# Patient Record
Sex: Male | Born: 1955 | Race: White | Hispanic: No | Marital: Single | Smoking: Former smoker
Health system: Southern US, Community
[De-identification: ages and names within clinical notes are randomized; demographics above are authoritative.]

## PROBLEM LIST (undated history)

## (undated) DIAGNOSIS — R000 Tachycardia, unspecified: Secondary | ICD-10-CM

## (undated) DIAGNOSIS — R51 Headache: Secondary | ICD-10-CM

## (undated) DIAGNOSIS — Q249 Congenital malformation of heart, unspecified: Secondary | ICD-10-CM

## (undated) DIAGNOSIS — J449 Chronic obstructive pulmonary disease, unspecified: Secondary | ICD-10-CM

## (undated) DIAGNOSIS — Q248 Other specified congenital malformations of heart: Secondary | ICD-10-CM

## (undated) DIAGNOSIS — I10 Essential (primary) hypertension: Secondary | ICD-10-CM

## (undated) DIAGNOSIS — R519 Headache, unspecified: Secondary | ICD-10-CM

## (undated) DIAGNOSIS — G8929 Other chronic pain: Secondary | ICD-10-CM

## (undated) DIAGNOSIS — G4733 Obstructive sleep apnea (adult) (pediatric): Secondary | ICD-10-CM

## (undated) DIAGNOSIS — E785 Hyperlipidemia, unspecified: Secondary | ICD-10-CM

## (undated) HISTORY — DX: Other chronic pain: G89.29

## (undated) HISTORY — DX: Congenital malformation of heart, unspecified: Q24.9

## (undated) HISTORY — DX: Headache, unspecified: R51.9

## (undated) HISTORY — DX: Tachycardia, unspecified: R00.0

## (undated) HISTORY — DX: Chronic obstructive pulmonary disease, unspecified: J44.9

## (undated) HISTORY — DX: Hyperlipidemia, unspecified: E78.5

## (undated) HISTORY — DX: Obstructive sleep apnea (adult) (pediatric): G47.33

## (undated) HISTORY — DX: Headache: R51

## (undated) HISTORY — DX: Other specified congenital malformations of heart: Q24.8

## (undated) HISTORY — DX: Essential (primary) hypertension: I10

---

## 1998-01-28 ENCOUNTER — Inpatient Hospital Stay (HOSPITAL_COMMUNITY): Admission: EM | Admit: 1998-01-28 | Discharge: 1998-02-03 | Payer: Self-pay | Admitting: Emergency Medicine

## 1998-01-28 ENCOUNTER — Encounter: Payer: Self-pay | Admitting: Emergency Medicine

## 1998-01-28 ENCOUNTER — Encounter: Payer: Self-pay | Admitting: Surgery

## 1998-01-28 ENCOUNTER — Encounter: Payer: Self-pay | Admitting: Pediatrics

## 1999-06-09 ENCOUNTER — Encounter: Payer: Self-pay | Admitting: General Surgery

## 1999-06-10 ENCOUNTER — Observation Stay (HOSPITAL_COMMUNITY): Admission: RE | Admit: 1999-06-10 | Discharge: 1999-06-11 | Payer: Self-pay | Admitting: General Surgery

## 2000-06-24 ENCOUNTER — Emergency Department (HOSPITAL_COMMUNITY): Admission: EM | Admit: 2000-06-24 | Discharge: 2000-06-24 | Payer: Self-pay | Admitting: Emergency Medicine

## 2000-06-24 ENCOUNTER — Encounter: Payer: Self-pay | Admitting: Emergency Medicine

## 2001-10-19 ENCOUNTER — Encounter: Payer: Self-pay | Admitting: Emergency Medicine

## 2001-10-19 ENCOUNTER — Emergency Department (HOSPITAL_COMMUNITY): Admission: EM | Admit: 2001-10-19 | Discharge: 2001-10-19 | Payer: Self-pay | Admitting: Emergency Medicine

## 2003-10-21 ENCOUNTER — Emergency Department (HOSPITAL_COMMUNITY): Admission: EM | Admit: 2003-10-21 | Discharge: 2003-10-21 | Payer: Self-pay | Admitting: Emergency Medicine

## 2006-05-30 ENCOUNTER — Encounter: Admission: RE | Admit: 2006-05-30 | Discharge: 2006-05-30 | Payer: Self-pay | Admitting: Family Medicine

## 2007-11-14 ENCOUNTER — Emergency Department (HOSPITAL_COMMUNITY): Admission: EM | Admit: 2007-11-14 | Discharge: 2007-11-14 | Payer: Self-pay | Admitting: Emergency Medicine

## 2008-03-16 ENCOUNTER — Emergency Department (HOSPITAL_COMMUNITY): Admission: EM | Admit: 2008-03-16 | Discharge: 2008-03-16 | Payer: Self-pay | Admitting: Emergency Medicine

## 2008-04-20 ENCOUNTER — Ambulatory Visit: Payer: Self-pay | Admitting: Pulmonary Disease

## 2008-04-20 ENCOUNTER — Encounter: Payer: Self-pay | Admitting: Pulmonary Disease

## 2008-04-20 DIAGNOSIS — G4733 Obstructive sleep apnea (adult) (pediatric): Secondary | ICD-10-CM | POA: Insufficient documentation

## 2008-04-20 DIAGNOSIS — J309 Allergic rhinitis, unspecified: Secondary | ICD-10-CM | POA: Insufficient documentation

## 2008-04-20 DIAGNOSIS — I10 Essential (primary) hypertension: Secondary | ICD-10-CM | POA: Insufficient documentation

## 2008-04-20 DIAGNOSIS — R0602 Shortness of breath: Secondary | ICD-10-CM | POA: Insufficient documentation

## 2008-04-20 DIAGNOSIS — I499 Cardiac arrhythmia, unspecified: Secondary | ICD-10-CM | POA: Insufficient documentation

## 2008-04-27 ENCOUNTER — Encounter: Payer: Self-pay | Admitting: Pulmonary Disease

## 2008-09-17 ENCOUNTER — Emergency Department (HOSPITAL_COMMUNITY): Admission: EM | Admit: 2008-09-17 | Discharge: 2008-09-17 | Payer: Self-pay | Admitting: Emergency Medicine

## 2009-02-25 ENCOUNTER — Encounter: Admission: RE | Admit: 2009-02-25 | Discharge: 2009-02-25 | Payer: Self-pay | Admitting: Family Medicine

## 2009-07-16 ENCOUNTER — Encounter: Admission: RE | Admit: 2009-07-16 | Discharge: 2009-07-16 | Payer: Self-pay | Admitting: Family Medicine

## 2010-04-30 LAB — COMPREHENSIVE METABOLIC PANEL
Albumin: 4.1 g/dL (ref 3.5–5.2)
Creatinine, Ser: 1.09 mg/dL (ref 0.4–1.5)
Glucose, Bld: 108 mg/dL — ABNORMAL HIGH (ref 70–99)
Potassium: 3.7 mEq/L (ref 3.5–5.1)
Sodium: 140 mEq/L (ref 135–145)

## 2010-04-30 LAB — URINALYSIS, ROUTINE W REFLEX MICROSCOPIC
Bilirubin Urine: NEGATIVE
Glucose, UA: NEGATIVE mg/dL
Hgb urine dipstick: NEGATIVE
Nitrite: NEGATIVE
pH: 6.5 (ref 5.0–8.0)

## 2010-04-30 LAB — DIFFERENTIAL
Eosinophils Absolute: 0.1 10*3/uL (ref 0.0–0.7)
Monocytes Relative: 11 % (ref 3–12)

## 2010-04-30 LAB — CBC
HCT: 49.3 % (ref 39.0–52.0)
MCHC: 34.6 g/dL (ref 30.0–36.0)
MCV: 90 fL (ref 78.0–100.0)
Platelets: 161 10*3/uL (ref 150–400)
RBC: 5.48 MIL/uL (ref 4.22–5.81)
RDW: 13.6 % (ref 11.5–15.5)

## 2010-04-30 LAB — LIPASE, BLOOD: Lipase: 20 U/L (ref 11–59)

## 2010-04-30 LAB — LACTIC ACID, PLASMA: Lactic Acid, Venous: 2.3 mmol/L — ABNORMAL HIGH (ref 0.5–2.2)

## 2010-05-10 LAB — DIFFERENTIAL
Basophils Absolute: 0 10*3/uL (ref 0.0–0.1)
Basophils Relative: 1 % (ref 0–1)
Eosinophils Absolute: 0.1 10*3/uL (ref 0.0–0.7)
Eosinophils Relative: 2 % (ref 0–5)
Lymphs Abs: 1.1 10*3/uL (ref 0.7–4.0)
Monocytes Absolute: 0.8 10*3/uL (ref 0.1–1.0)
Monocytes Relative: 14 % — ABNORMAL HIGH (ref 3–12)
Neutro Abs: 3.3 10*3/uL (ref 1.7–7.7)
Neutrophils Relative %: 63 % (ref 43–77)

## 2010-05-10 LAB — BLOOD GAS, ARTERIAL
Patient temperature: 98.6
TCO2: 16.4 mmol/L (ref 0–100)
pH, Arterial: 7.594 — ABNORMAL HIGH (ref 7.350–7.450)

## 2010-05-10 LAB — POCT I-STAT, CHEM 8
BUN: 17 mg/dL (ref 6–23)
Chloride: 107 mEq/L (ref 96–112)
Glucose, Bld: 99 mg/dL (ref 70–99)
Hemoglobin: 16 g/dL (ref 13.0–17.0)
Sodium: 141 mEq/L (ref 135–145)

## 2010-05-10 LAB — CBC
HCT: 46.9 % (ref 39.0–52.0)
Hemoglobin: 16.3 g/dL (ref 13.0–17.0)
MCHC: 34.7 g/dL (ref 30.0–36.0)
Platelets: 135 10*3/uL — ABNORMAL LOW (ref 150–400)
RDW: 13 % (ref 11.5–15.5)
WBC: 5.4 10*3/uL (ref 4.0–10.5)

## 2010-10-24 LAB — BASIC METABOLIC PANEL
BUN: 19
CO2: 24
Calcium: 9.1
Creatinine, Ser: 1.01
GFR calc Af Amer: >60
GFR calc non Af Amer: >60
Glucose, Bld: 140 — ABNORMAL HIGH
Potassium: 4.2
Sodium: 140

## 2010-10-24 LAB — DIFFERENTIAL
Eosinophils Relative: 1
Lymphocytes Relative: 10 — ABNORMAL LOW
Lymphs Abs: 1
Monocytes Absolute: 0.6

## 2010-10-24 LAB — CBC
HCT: 48.7
Hemoglobin: 16.9
WBC: 10.4

## 2010-10-24 LAB — POCT CARDIAC MARKERS
Myoglobin, poc: 127
Troponin i, poc: <0.05

## 2010-10-24 LAB — BLOOD GAS, ARTERIAL
Bicarbonate: 22.8
TCO2: 19.4
pCO2 arterial: 35.7
pH, Arterial: 7.421

## 2010-10-24 LAB — CULTURE, BLOOD (ROUTINE X 2)

## 2012-04-26 ENCOUNTER — Ambulatory Visit (INDEPENDENT_AMBULATORY_CARE_PROVIDER_SITE_OTHER)
Admission: RE | Admit: 2012-04-26 | Discharge: 2012-04-26 | Disposition: A | Discharge status: Home / Self Care | Payer: 59 | Source: Ambulatory Visit | Attending: Internal Medicine | Admitting: Internal Medicine

## 2012-04-26 ENCOUNTER — Encounter: Payer: Self-pay | Admitting: Internal Medicine

## 2012-04-26 ENCOUNTER — Ambulatory Visit (INDEPENDENT_AMBULATORY_CARE_PROVIDER_SITE_OTHER): Payer: 59 | Admitting: Internal Medicine

## 2012-04-26 VITALS — BP 154/100 | HR 94 | Temp 98.3°F | Ht 70.5 in | Wt 263.0 lb

## 2012-04-26 DIAGNOSIS — R0609 Other forms of dyspnea: Secondary | ICD-10-CM

## 2012-04-26 DIAGNOSIS — I10 Essential (primary) hypertension: Secondary | ICD-10-CM

## 2012-04-26 DIAGNOSIS — F17200 Nicotine dependence, unspecified, uncomplicated: Secondary | ICD-10-CM

## 2012-04-26 DIAGNOSIS — J441 Chronic obstructive pulmonary disease with (acute) exacerbation: Secondary | ICD-10-CM

## 2012-04-26 DIAGNOSIS — J449 Chronic obstructive pulmonary disease, unspecified: Secondary | ICD-10-CM | POA: Insufficient documentation

## 2012-04-26 DIAGNOSIS — R0600 Dyspnea, unspecified: Secondary | ICD-10-CM

## 2012-04-26 MED ORDER — FAMOTIDINE 20 MG PO TABS: ORAL_TABLET | ORAL | Status: DC

## 2012-04-26 MED ORDER — TRAMADOL HCL 50 MG PO TABS: ORAL_TABLET | ORAL | Status: DC

## 2012-04-26 MED ORDER — PANTOPRAZOLE SODIUM 40 MG PO TBEC: 40.0000 mg | DELAYED_RELEASE_TABLET | Freq: Every day | ORAL | Status: DC

## 2012-04-26 NOTE — Progress Notes (Signed)
  Subjective:    Patient ID: James Kidd, male    DOB: 07/15/1955    MRN: [id number]  HPI  69 yowm smoker dx copd by [facility] referred by Dr Cyndia Bent for re-evaluation   04/26/2012 1st pulmonary cc indolent onset x > 10 year sob progressed to point where can still walk distances like the mall but has to be slow and flat.  Ok at rest. Has to sleep at 30 degrees due to breathing and then episodes where much worse, esp with cold wet weather,  Assoc with daily dry cough so hard passes out occasionally but frequently feels presyncope with cough assoc with midline chest discomfort. Take daily spiriva and lots of saba but doesn't think it helps much. Prednisone helped a lot the last time (2 months prior to OV  He took it.  No obvious daytime variabilty or assoc chronic cough or cp or chest tightness, subjective wheeze overt sinus or hb symptoms. No unusual exp hx or h/o childhood pna/ asthma or premature birth to his knowledge.   Once gets to sleep at 30 degrees Sleeping ok without nocturnal  or early am exacerbation  of respiratory  c/o's or need for noct saba. Also denies any obvious fluctuation of symptoms with weather or environmental changes or other aggravating or alleviating factors except as outlined above   Review of Systems  Constitutional: Negative for fever, chills, activity change, appetite change and unexpected weight change.  HENT: Positive for congestion. Negative for sore throat, rhinorrhea, sneezing, trouble swallowing, dental problem, voice change and postnasal drip.   Eyes: Negative for visual disturbance.  Respiratory: Positive for cough and shortness of breath. Negative for choking.   Cardiovascular: Positive for chest pain and leg swelling.  Gastrointestinal: Negative for nausea, vomiting and abdominal pain.  Genitourinary: Negative for difficulty urinating.  Musculoskeletal: Positive for arthralgias.  Skin: Negative for rash.  Psychiatric/Behavioral: Negative for behavioral  problems and confusion.       Objective:   Physical Exam  Wt Readings from Last 3 Encounters:  04/26/12 263 lb (119.296 kg)    Hoarse wm nad at rest HEENT mild turbinate edema.  Oropharynx no thrush or excess pnd or cobblestoning.  No JVD or cervical adenopathy. Mild accessory muscle hypertrophy. Trachea midline, nl thryroid. Chest was hyperinflated by percussion with diminished breath sounds and moderate increased exp time without wheeze. Hoover sign positive at mid inspiration. Regular rate and rhythm without murmur gallop or rub or increase P2 or edema.  Abd: no hsm, nl excursion. Ext warm without cyanosis or clubbing.    CXR  04/26/2012 :  No active disease. No significant change.         Assessment & Plan:

## 2012-04-26 NOTE — Patient Instructions (Addendum)
Take delsym two tsp every 12 hours and supplement if needed with  tramadol 50 mg up to 2 every 4 hours to suppress the urge to cough. Swallowing water or using ice chips/non mint and menthol containing candies (such as lifesavers or sugarless jolly ranchers) are also effective.  You should rest your voice and avoid activities that you know make you cough.  Once you have eliminated the cough for 3 straight days try reducing the tramadol first,  then the delsym as tolerated.    Stop spiriva and take symbicort 160 Take 2 puffs first thing in am and then another 2 puffs about 12 hours later.   Pantoprazole (protonix) 40 mg   Take 30-60 min before first meal of the day and Pepcid 20 mg one bedtime until return to office - this is the best way to tell whether stomach acid is contributing to your problem.    Only use your albuterol(proventil)  as a rescue medication to be used if you can't catch your breath by resting or doing a relaxed purse lip breathing pattern. The less you use it, the better it will work when you need it.  Ok to use up to every 4 hours   GERD (REFLUX)  is an extremely common cause of respiratory symptoms, many times with no significant heartburn at all.    It can be treated with medication, but also with lifestyle changes including avoidance of late meals, excessive alcohol, smoking cessation, and avoid fatty foods, chocolate, peppermint, colas, red wine, and acidic juices such as orange juice.  NO MINT OR MENTHOL PRODUCTS SO NO COUGH DROPS  USE SUGARLESS CANDY INSTEAD (jolley ranchers or Stover's)  NO OIL BASED VITAMINS - use powdered substitutes.   Please remember to go to the x-ray department downstairs for your tests - we will call you with the results when they are available.     Please schedule a follow up office visit in 4 weeks, sooner if needed with pfts

## 2012-04-27 DIAGNOSIS — F17200 Nicotine dependence, unspecified, uncomplicated: Secondary | ICD-10-CM | POA: Insufficient documentation

## 2012-04-27 NOTE — Assessment & Plan Note (Signed)
Says "never changes, even on multiple meds"  > defer to Dr Cyndia Bent

## 2012-04-27 NOTE — Assessment & Plan Note (Addendum)
-   PFT's 04/20/08  FEV1  2.54 (69%) ratio 58 and no change p B2  DLCO 55 corrects to 88 - hfa 75% p coaching 04/27/2012    DDX of  difficult airways managment all start with A and  include Adherence, Ace Inhibitors, Acid Reflux, Active Sinus Disease, Alpha 1 Antitripsin deficiency, Anxiety masquerading as Airways dz,  ABPA,  allergy(esp in young), Aspiration (esp in elderly), Adverse effects of DPI,  Active smokers, plus two Bs  = Bronchiectasis and Beta blocker use..and one C= CHF   Adherence is always the initial "prime suspect" and is a multilayered concern that requires a "trust but verify" approach in every patient - starting with knowing how to use medications, especially inhalers, correctly, keeping up with refills and understanding the fundamental difference between maintenance and prns vs those medications only taken for a very short course and then stopped and not refilled. The proper method of use, as well as anticipated side effects, of a metered-dose inhaler are discussed and demonstrated to the patient. Improved effectiveness after extensive coaching during this visit to a level of approximately  75% so try symbicort 160 2bid  ? Adverse effect of dpi > try off spiriva  Active smoking > discussed separately  ? Acid reflux > max rx reviewed  F/u in 4 weeks with pft    Each maintenance medication was reviewed in detail including most importantly the difference between maintenance and as needed and under what circumstances the prns are to be used.  Please see instructions for details which were reviewed in writing and the patient given a copy.

## 2012-04-27 NOTE — Assessment & Plan Note (Signed)

## 2012-04-29 NOTE — Progress Notes (Signed)
Quick Note:  Spoke with pt and notified of results per Dr. Wert. Pt verbalized understanding and denied any questions.  ______ 

## 2012-06-07 ENCOUNTER — Ambulatory Visit (INDEPENDENT_AMBULATORY_CARE_PROVIDER_SITE_OTHER): Payer: 59 | Admitting: Internal Medicine

## 2012-06-07 ENCOUNTER — Encounter: Payer: Self-pay | Admitting: Internal Medicine

## 2012-06-07 VITALS — BP 130/82 | HR 80 | Temp 97.8°F | Ht 71.0 in | Wt 257.0 lb

## 2012-06-07 DIAGNOSIS — J449 Chronic obstructive pulmonary disease, unspecified: Secondary | ICD-10-CM

## 2012-06-07 DIAGNOSIS — F17200 Nicotine dependence, unspecified, uncomplicated: Secondary | ICD-10-CM

## 2012-06-07 LAB — PULMONARY FUNCTION TEST

## 2012-06-07 NOTE — Patient Instructions (Addendum)
You only have mild copd which is likely to stabilize where it is if your stop smoking now, which will also help your cough  Continue symbicort Take 2 puffs first thing in am and then another 2 puffs about 12 hours later.   Only use your albuterol (proventil)as a rescue medication to be used if you can't catch your breath by resting or doing a relaxed purse lip breathing pattern. The less you use it, the better it will work when you need it.    If you are satisfied with your treatment plan let your doctor know and he/she can either refill your medications or you can return here when your prescription runs out.     If in any way you are not 100% satisfied,  please tell us.  If 100% better, tell your friends!

## 2012-06-07 NOTE — Progress Notes (Signed)
PFT done today. 

## 2012-06-07 NOTE — Progress Notes (Signed)
Subjective:    Patient ID: James Kidd, male    DOB: August 06, 1955    MRN: [id number]   Brief patient profile:  33 yowm smoker dx copd by [facility] referred by Dr James Kidd for re-evaluation with GOLD II criteria as of 05/2012    04/26/2012 1st pulmonary cc indolent onset x > 10 year sob progressed to point where can still walk distances like the mall but has to be slow and flat.  Ok at rest. Has to sleep at 30 degrees due to breathing and then episodes where much worse, esp with cold wet weather,  Assoc with daily dry cough so hard passes out occasionally but frequently feels presyncope with cough assoc with midline chest discomfort. Take daily spiriva and lots of saba but doesn't think it helps much. Prednisone helped a lot the last time (2 months prior to OV  He took it. rec Take delsym two tsp every 12 hours and supplement if needed with  tramadol 50 mg up to 2 every 4 hours to suppress the urge to cough. Swallowing water or using ice chips/non mint and menthol containing candies (such as lifesavers or sugarless jolly ranchers) are also effective.  You should rest your voice and avoid activities that you know make you cough. Once you have eliminated the cough for 3 straight days try reducing the tramadol first,  then the delsym as tolerated.   Stop spiriva and take symbicort 160 Take 2 puffs first thing in am and then another 2 puffs about 12 hours later.  Pantoprazole (protonix) 40 mg   Take 30-60 min before first meal of the day and Pepcid 20 mg one bedtime until return to office - this is the best way to tell whether stomach acid is contributing to your problem.   Only use your albuterol(proventil)  as a rescue medication   06/07/2012 f/u ov/James Kidd f/u copd GOLD II / still smoking  Chief Complaint  Patient presents with  . Followup with PFT    Pt states his breathing is about the same, no better or worse since the last visit. No new co's today.   still has sense of chest congestion day = night  but cough severity is better, min mucoid sputut, doe so still using saba more than twice daily    No obvious daytime variabilty or assoc  cp or chest tightness, subjective wheeze overt sinus or hb symptoms. No unusual exp hx or h/o childhood pna/ asthma or premature birth to his knowledge.   Once gets to sleep at 30 degrees Sleeping ok without nocturnal  or early am exacerbation  of respiratory  c/o's or need for noct saba. Also denies any obvious fluctuation of symptoms with weather or environmental changes or other aggravating or alleviating factors except as outlined above   Current Medications, Allergies, Past Medical History, Past Surgical History, Family History, and Social History were reviewed in James Kidd record.  ROS  The following are not active complaints unless bolded sore throat, dysphagia, dental problems, itching, sneezing,  nasal congestion or excess/ purulent secretions, ear ache,   fever, chills, sweats, unintended wt loss, pleuritic or exertional cp, hemoptysis,  orthopnea pnd or leg swelling, presyncope, palpitations, heartburn, abdominal pain, anorexia, nausea, vomiting, diarrhea  or change in bowel or urinary habits, change in stools or urine, dysuria,hematuria,  rash, arthralgias, visual complaints, headache, numbness weakness or ataxia or problems with walking or coordination,  change in mood/affect or memory.  Objective:   Physical Exam  Wt Readings from Last 3 Encounters:  06/07/12 257 lb (116.574 kg)  04/26/12 263 lb (119.296 kg)      Hoarse wm nad at rest/ evasive with questions HEENT mild turbinate edema.  Oropharynx no thrush or excess pnd or cobblestoning.  No JVD or cervical adenopathy. Mild accessory muscle hypertrophy. Trachea midline, nl thryroid. Chest was hyperinflated by percussion with diminished breath sounds and moderate increased exp time without wheeze. Hoover sign positive at mid inspiration. Regular rate and  rhythm without murmur gallop or rub or increase P2 or edema.  Abd: no hsm, nl excursion. Ext warm without cyanosis or clubbing.    CXR  04/26/2012 :  No active disease. No significant change.         Assessment & Plan:

## 2012-06-09 NOTE — Assessment & Plan Note (Signed)
>   3 min  I reviewed the Flethcher curve with patient that basically indicates  if you quit smoking when your best day FEV1 is still well preserved(which his clearly is)  it is highly unlikely you will progress to severe disease and informed the patient there was no medication on the market that has proven to change the curve or the likelihood of progression.  Therefore stopping smoking and maintaining abstinence is the most important aspect of care, not choice of inhalers or for that matter, doctors.    Cough should improve very quickly once stops smoking as should over reliance on saba

## 2012-06-09 NOTE — Assessment & Plan Note (Signed)
-   PFT's 04/20/08  FEV1  2.54 (69%) ratio 58 and no change p B2  DLCO 55 corrects to 88 - PFT's 06/07/2012  FEV1  1.93 (49%)  But 65% p B2 (31% improvement) DLCO 77 - hfa 90% 06/07/2012 p coaching   So post bronchodilator he is not much different from 2010 but unfortunately still smoking  DDX of  difficult airways managment all start with A and  include Adherence, Ace Inhibitors, Acid Reflux, Active Sinus Disease, Alpha 1 Antitripsin deficiency, Anxiety masquerading as Airways dz,  ABPA,  allergy(esp in young), Aspiration (esp in elderly), Adverse effects of DPI,  Active smokers, plus two Bs  = Bronchiectasis and Beta blocker use..and one C= CHF   In this case Adherence is the biggest issue and starts with  inability to use HFA effectively and also  understand that SABA treats the symptoms but doesn't get to the underlying problem (inflammation).  I used  the analogy of putting steroid cream on a rash to help explain the meaning of topical therapy and the need to get the drug to the target tissue.  The proper method of use, as well as anticipated side effects, of a metered-dose inhaler are discussed and demonstrated to the patient. Improved effectiveness after extensive coaching during this visit to a level of approximately 90% from a baseline of < 50%  rec continue symbicort 160 2bid

## 2012-06-19 ENCOUNTER — Encounter: Payer: Self-pay | Admitting: Internal Medicine

## 2012-11-19 ENCOUNTER — Encounter: Payer: Self-pay | Admitting: Interventional Cardiology

## 2012-11-20 ENCOUNTER — Ambulatory Visit (INDEPENDENT_AMBULATORY_CARE_PROVIDER_SITE_OTHER): Payer: 59 | Admitting: Interventional Cardiology

## 2012-11-20 ENCOUNTER — Encounter: Payer: Self-pay | Admitting: Interventional Cardiology

## 2012-11-20 ENCOUNTER — Encounter (INDEPENDENT_AMBULATORY_CARE_PROVIDER_SITE_OTHER): Payer: Self-pay

## 2012-11-20 VITALS — BP 150/100 | HR 89 | Ht 70.0 in | Wt 268.8 lb

## 2012-11-20 DIAGNOSIS — I517 Cardiomegaly: Secondary | ICD-10-CM | POA: Insufficient documentation

## 2012-11-20 DIAGNOSIS — R05 Cough: Secondary | ICD-10-CM

## 2012-11-20 DIAGNOSIS — I5032 Chronic diastolic (congestive) heart failure: Secondary | ICD-10-CM

## 2012-11-20 DIAGNOSIS — I10 Essential (primary) hypertension: Secondary | ICD-10-CM

## 2012-11-20 DIAGNOSIS — R0609 Other forms of dyspnea: Secondary | ICD-10-CM

## 2012-11-20 DIAGNOSIS — R0600 Dyspnea, unspecified: Secondary | ICD-10-CM

## 2012-11-20 DIAGNOSIS — G4733 Obstructive sleep apnea (adult) (pediatric): Secondary | ICD-10-CM

## 2012-11-20 MED ORDER — LISINOPRIL-HYDROCHLOROTHIAZIDE 20-25 MG PO TABS: 1.0000 | ORAL_TABLET | Freq: Every day | ORAL | Status: DC

## 2012-11-20 NOTE — Progress Notes (Signed)
Patient ID: James Kidd, male   DOB: April 01, 1955, 57 y.o.   MRN: [id number]   Date: 11/20/2012 ID: James Kidd, DOB [birth year], MRN [id number] PCP: Eartha Inch, MD  Reason: Elevated blood pressure and dyspnea  ASSESSMENT;  1. Hypertension, essential with poor control and history of hypertrophy based on echocardiogram done at [facility] (data not available) 2. Dyspnea on exertion, probably multifactorial. Rule out component of diastolic heart failure 3. Left ventricular hypertrophy (documentation by echocardiography within the past year , according to the patient) 4. Chronic cough with occasional tussive syncope  PLAN:  1. Intensify antihypertensive regimen by increasing lisinopril to 20 mg daily along with 20 mg of furosemide. Once the current supply of medications is use we will switch to lisinopril HCT 20/25 mg daily 2. Six-week clinical followup 3. Basic metabolic panel will be done on return   SUBJECTIVE: James Kidd is a 57 y.o. male who is referred for help with blood pressure control and to exclude the possibility of cardiac involvement causing dyspnea. The patient has dyspnea on exertion. His been seen by [facility] and been told that his lungs are "okay". Pulmonary function tests were apparently done and used as the basis for the conclusion. He has been seen by cardiologists at [facility] where he underwent an echocardiogram which revealed LVH and a myocardial perfusion study that did not reveal ischemia. He subsequently has had a difficult time achieving blood pressure control. Recently he developed lower extremity edema and Dr. Cyndia Bent started furosemide 10 mg daily and resumed lisinopril 10 mg daily. Independently, the patient increase the furosemide to 20 mg per day and had dramatic improvement in edema. He denies palpitations, orthopnea, PND, chest pain, abdominal swelling, and transient neurological symptoms.   Allergies  Allergen  Reactions  . Codeine Sulfate Itching    Current Outpatient Prescriptions on File Prior to Visit  Medication Sig Dispense Refill  . famotidine (PEPCID) 20 MG tablet One at bedtime  30 tablet  2  . fluticasone (FLONASE) 50 MCG/ACT nasal spray Place 2 sprays into the nose daily as needed for rhinitis.      Marland Kitchen guaiFENesin (MUCINEX) 600 MG 12 hr tablet Take 600 mg by mouth 2 (two) times daily as needed for congestion.      Marland Kitchen PROVENTIL HFA 108 (90 BASE) MCG/ACT inhaler Inhale 1-2 puffs into the lungs every 6 (six) hours as needed.      . SYMBICORT 160-4.5 MCG/ACT inhaler 2 puffs twice daily      . traMADol (ULTRAM) 50 MG tablet 1-2 every 4 hours as needed for cough or pain  40 tablet  0   No current facility-administered medications on file prior to visit.    Past Medical History  Diagnosis Date  . Hypertension   . COPD (chronic obstructive pulmonary disease)   . Tachycardia   . Left ventricular hypoplasia   . OSA (obstructive sleep apnea)     on CPAP   . Chronic headaches   . Hyperlipidemia     No past surgical history on file.  History   Social History  . Marital Status: Single    Spouse Name: N/A    Number of Children: N/A  . Years of Education: N/A   Occupational History  . Not on file.   Social History Main Topics  . Smoking status: Current Every Day Smoker -- 1.00 packs/day for 40 years    Types: Cigarettes  . Smokeless tobacco: Never Used  . Alcohol Use:  No  . Drug Use: No  . Sexual Activity: Not on file   Other Topics Concern  . Not on file   Social History Narrative  . No narrative on file    Family History  Problem Relation Age of Onset  . Colon cancer Father   . Heart disease Maternal Aunt     ROS: Stable appetite. Recent weight gain. No blood in urine or stool. Chronic cough. Paroxysmal to cough have resulted in syncope. Denies hemoptysis. Other systems negative for complaints.  OBJECTIVE: BP 150/100  Pulse 89  Ht 5\' 10"  (1.778 m)  Wt 268 lb  12.8 oz (121.927 kg)  BMI 38.57 kg/m2,  General: No acute distress, obese HEENT: normal  Neck: JVD flat. Carotids no bruits Chest: Clear but with diminished breath sounds Cardiac: Murmur: 2 of 6 systolic murmur left lower sternal border. Gallop: S4. Rhythm: Regular. Other: Normal Abdomen: Bruit: None. Pulsation: None Extremities: Edema: None. Pulses: 2+ and symmetric in the radials and posterior tibials bilaterally Neuro: Normal Psych: Normal  ECG: Normal sinus rhythm with nonspecific ST abnormality. No LVH noted

## 2012-11-20 NOTE — Patient Instructions (Signed)
Start Lisinopril HCTZ 20/25mg  1 tablet daily in 1 week  Your physician recommends that you schedule a follow-up appointment in: 6 weeks  Your physician recommends that you return for lab work in: 6 weeks (BMET)

## 2013-01-01 ENCOUNTER — Encounter: Payer: Self-pay | Admitting: Interventional Cardiology

## 2013-01-01 ENCOUNTER — Ambulatory Visit (INDEPENDENT_AMBULATORY_CARE_PROVIDER_SITE_OTHER): Payer: 59 | Admitting: Interventional Cardiology

## 2013-01-01 VITALS — BP 130/93 | HR 92 | Ht 71.0 in | Wt 273.0 lb

## 2013-01-01 DIAGNOSIS — R05 Cough: Secondary | ICD-10-CM | POA: Insufficient documentation

## 2013-01-01 DIAGNOSIS — I10 Essential (primary) hypertension: Secondary | ICD-10-CM

## 2013-01-01 DIAGNOSIS — I5032 Chronic diastolic (congestive) heart failure: Secondary | ICD-10-CM

## 2013-01-01 DIAGNOSIS — I517 Cardiomegaly: Secondary | ICD-10-CM

## 2013-01-01 NOTE — Progress Notes (Addendum)
Patient ID: James Kidd, male   DOB: Oct 08, 1955, 57 y.o.   MRN: [id number]      [address] [place], [place]  [address] Phone: [phone] Fax:  [phone]  Date:  01/01/2013   ID:  James Kidd, DOB 15-Aug-1955, MRN [id number]  PCP:  Eartha Inch, MD   ASSESSMENT:  1. Dyspnea is persistent but improved 2. Hypertension with LVH. Hypertension is improved on the current medical regimen. 3. COPD  PLAN:  1. Continue lisinopril HCT 20/25 mg daily 2. Return to see me as needed for recurrent syncope or uncontrollable blood pressure 3. I considered adding a low-dose beta blocker but felt this may aggravate his pulmonary function   SUBJECTIVE: James Kidd is a 57 y.o. male who denies recurrence of syncope. Cough has been somewhat improved. Exertional tolerance is slightly improved. Headaches have significantly decreased. Weight is up 5 pounds since last visit and 16 pounds since May.   Wt Readings from Last 3 Encounters:  01/01/13 273 lb (123.832 kg)  11/20/12 268 lb 12.8 oz (121.927 kg)  06/07/12 257 lb (116.574 kg)     Past Medical History  Diagnosis Date  . Hypertension   . COPD (chronic obstructive pulmonary disease)   . Tachycardia   . Left ventricular hypoplasia   . OSA (obstructive sleep apnea)     on CPAP   . Chronic headaches   . Hyperlipidemia     Current Outpatient Prescriptions  Medication Sig Dispense Refill  . cetirizine (ZYRTEC) 10 MG tablet Take 10 mg by mouth as needed for allergies.      . famotidine (PEPCID) 20 MG tablet Take 20 mg by mouth at bedtime as needed. One at bedtime      . fluticasone (FLONASE) 50 MCG/ACT nasal spray Place 2 sprays into the nose daily as needed for rhinitis.      Marland Kitchen guaiFENesin (MUCINEX) 600 MG 12 hr tablet Take 600 mg by mouth 2 (two) times daily as needed for congestion.      Marland Kitchen lisinopril-hydrochlorothiazide (PRINZIDE,ZESTORETIC) 20-25 MG per tablet Take 1 tablet by mouth daily.  30 tablet  5   . PROVENTIL HFA 108 (90 BASE) MCG/ACT inhaler Inhale 1-2 puffs into the lungs as needed.       . tiotropium (SPIRIVA) 18 MCG inhalation capsule Place 18 mcg into inhaler and inhale daily.       No current facility-administered medications for this visit.    Allergies:    Allergies  Allergen Reactions  . Codeine Sulfate Itching    Social History:  The patient  reports that he has been smoking Cigarettes.  He has a 40 pack-year smoking history. He has never used smokeless tobacco. He reports that he does not drink alcohol or use illicit drugs.   ROS:  Please see the history of present illness.   All other systems reviewed and negative.   OBJECTIVE: VS:  BP 130/93  Pulse 92  Ht 5\' 11"  (1.803 m)  Wt 273 lb (123.832 kg)  BMI 38.09 kg/m2 Well nourished, well developed, in no acute distress, obese HEENT: normal Neck: JVD flat. Carotid bruit 2+  Cardiac:  normal S1, S2; RRR; no murmur. S4 gallop is audible Lungs:  clear to auscultation bilaterally, no wheezing, rhonchi or rales Abd: soft, nontender, no hepatomegaly Ext: Edema absent. Pulses 2+ Skin: warm and dry Neuro:  CNs 2-12 intact, no focal abnormalities noted  EKG:  Not repeated    Signed, Barry Dienes.  Flo Shanks III, MD 01/01/2013 1:58 PM

## 2013-01-01 NOTE — Patient Instructions (Signed)
Your physician recommends that you continue on your current medications as directed. Please refer to the Current Medication list given to you today.   Your physician recommends that you schedule a follow-up appointment as needed  

## 2013-05-30 ENCOUNTER — Encounter (HOSPITAL_COMMUNITY): Payer: Self-pay | Admitting: Emergency Medicine

## 2013-05-30 ENCOUNTER — Emergency Department (HOSPITAL_COMMUNITY): Payer: 59

## 2013-05-30 ENCOUNTER — Inpatient Hospital Stay (HOSPITAL_COMMUNITY)
Admission: EM | Admit: 2013-05-30 | Discharge: 2013-06-02 | DRG: 192 | Disposition: A | Discharge status: Home / Self Care | Payer: 59 | Attending: Emergency Medicine | Admitting: Emergency Medicine

## 2013-05-30 DIAGNOSIS — I499 Cardiac arrhythmia, unspecified: Secondary | ICD-10-CM

## 2013-05-30 DIAGNOSIS — R0603 Acute respiratory distress: Secondary | ICD-10-CM

## 2013-05-30 DIAGNOSIS — R05 Cough: Secondary | ICD-10-CM

## 2013-05-30 DIAGNOSIS — F411 Generalized anxiety disorder: Secondary | ICD-10-CM | POA: Diagnosis present

## 2013-05-30 DIAGNOSIS — I10 Essential (primary) hypertension: Secondary | ICD-10-CM | POA: Diagnosis present

## 2013-05-30 DIAGNOSIS — J441 Chronic obstructive pulmonary disease with (acute) exacerbation: Principal | ICD-10-CM | POA: Diagnosis present

## 2013-05-30 DIAGNOSIS — I517 Cardiomegaly: Secondary | ICD-10-CM

## 2013-05-30 DIAGNOSIS — I5032 Chronic diastolic (congestive) heart failure: Secondary | ICD-10-CM

## 2013-05-30 DIAGNOSIS — F17200 Nicotine dependence, unspecified, uncomplicated: Secondary | ICD-10-CM | POA: Diagnosis present

## 2013-05-30 DIAGNOSIS — R0602 Shortness of breath: Secondary | ICD-10-CM

## 2013-05-30 DIAGNOSIS — R55 Syncope and collapse: Secondary | ICD-10-CM

## 2013-05-30 DIAGNOSIS — G4733 Obstructive sleep apnea (adult) (pediatric): Secondary | ICD-10-CM | POA: Diagnosis present

## 2013-05-30 DIAGNOSIS — J301 Allergic rhinitis due to pollen: Secondary | ICD-10-CM | POA: Diagnosis present

## 2013-05-30 DIAGNOSIS — Z800 Family history of malignant neoplasm of digestive organs: Secondary | ICD-10-CM

## 2013-05-30 DIAGNOSIS — Z8249 Family history of ischemic heart disease and other diseases of the circulatory system: Secondary | ICD-10-CM

## 2013-05-30 DIAGNOSIS — J309 Allergic rhinitis, unspecified: Secondary | ICD-10-CM

## 2013-05-30 DIAGNOSIS — Z6838 Body mass index (BMI) 38.0-38.9, adult: Secondary | ICD-10-CM

## 2013-05-30 DIAGNOSIS — J9600 Acute respiratory failure, unspecified whether with hypoxia or hypercapnia: Secondary | ICD-10-CM

## 2013-05-30 DIAGNOSIS — D696 Thrombocytopenia, unspecified: Secondary | ICD-10-CM | POA: Diagnosis present

## 2013-05-30 DIAGNOSIS — E785 Hyperlipidemia, unspecified: Secondary | ICD-10-CM | POA: Diagnosis present

## 2013-05-30 DIAGNOSIS — R0902 Hypoxemia: Secondary | ICD-10-CM | POA: Diagnosis present

## 2013-05-30 DIAGNOSIS — E876 Hypokalemia: Secondary | ICD-10-CM | POA: Diagnosis present

## 2013-05-30 DIAGNOSIS — J449 Chronic obstructive pulmonary disease, unspecified: Secondary | ICD-10-CM

## 2013-05-30 LAB — BLOOD GAS, ARTERIAL
Acid-Base Excess: 0 mmol/L (ref 0.0–2.0)
Bicarbonate: 22.7 mEq/L (ref 20.0–24.0)
DRAWN BY: 235321
FIO2: 0.3 %
MODE: POSITIVE
O2 Saturation: 99.3 %
PATIENT TEMPERATURE: 97.3
PEEP: 4 cmH2O
Pressure support: 4 cmH2O
TCO2: 19 mmol/L (ref 0–100)
pCO2 arterial: 32 mmHg — ABNORMAL LOW (ref 35.0–45.0)
pH, Arterial: 7.461 — ABNORMAL HIGH (ref 7.350–7.450)
pO2, Arterial: 167 mmHg — ABNORMAL HIGH (ref 80.0–100.0)

## 2013-05-30 LAB — CBC
HCT: 44.2 % (ref 39.0–52.0)
Hemoglobin: 16.1 g/dL (ref 13.0–17.0)
MCH: 30.7 pg (ref 26.0–34.0)
MCHC: 36.4 g/dL — ABNORMAL HIGH (ref 30.0–36.0)
MCV: 84.4 fL (ref 78.0–100.0)
PLATELETS: 129 10*3/uL — AB (ref 150–400)
RBC: 5.24 MIL/uL (ref 4.22–5.81)
RDW: 13.2 % (ref 11.5–15.5)
WBC: 5.3 10*3/uL (ref 4.0–10.5)

## 2013-05-30 LAB — TROPONIN I
Troponin I: <0.3 ng/mL (ref <0.30)
Troponin I: <0.3 ng/mL (ref <0.30)

## 2013-05-30 LAB — COMPREHENSIVE METABOLIC PANEL
ALT: 36 U/L (ref 0–53)
AST: 32 U/L (ref 0–37)
Albumin: 3.7 g/dL (ref 3.5–5.2)
Alkaline Phosphatase: 67 U/L (ref 39–117)
BUN: 12 mg/dL (ref 6–23)
CALCIUM: 9.1 mg/dL (ref 8.4–10.5)
CO2: 24 meq/L (ref 19–32)
CREATININE: 1 mg/dL (ref 0.50–1.35)
Chloride: 96 mEq/L (ref 96–112)
GFR, EST NON AFRICAN AMERICAN: 83 mL/min — AB (ref >90)
GLUCOSE: 150 mg/dL — AB (ref 70–99)
Potassium: 3.3 mEq/L — ABNORMAL LOW (ref 3.7–5.3)
Sodium: 137 mEq/L (ref 137–147)
TOTAL PROTEIN: 7.4 g/dL (ref 6.0–8.3)
Total Bilirubin: 0.7 mg/dL (ref 0.3–1.2)

## 2013-05-30 LAB — I-STAT TROPONIN, ED: Troponin i, poc: 0 ng/mL (ref 0.00–0.08)

## 2013-05-30 LAB — PRO B NATRIURETIC PEPTIDE: PRO B NATRI PEPTIDE: 54.6 pg/mL (ref 0–125)

## 2013-05-30 LAB — MRSA PCR SCREENING: MRSA BY PCR: NEGATIVE

## 2013-05-30 MED ORDER — SALINE SPRAY 0.65 % NA SOLN
1.0000 | NASAL | Status: DC | PRN
  Filled 2013-05-30: qty 44

## 2013-05-30 MED ORDER — SODIUM CHLORIDE 0.9 % IV SOLN: 250.0000 mL | INTRAVENOUS | Status: DC | PRN

## 2013-05-30 MED ORDER — GUAIFENESIN ER 600 MG PO TB12
600.0000 mg | ORAL_TABLET | Freq: Two times a day (BID) | ORAL | Status: DC | PRN
  Administered 2013-05-30 – 2013-06-01 (×4): 600 mg via ORAL
  Filled 2013-05-30 (×3): qty 1

## 2013-05-30 MED ORDER — ASPIRIN 81 MG PO CHEW
324.0000 mg | CHEWABLE_TABLET | ORAL | Status: AC
  Administered 2013-05-30: 324 mg via ORAL
  Filled 2013-05-30: qty 4

## 2013-05-30 MED ORDER — PREDNISONE 20 MG PO TABS
40.0000 mg | ORAL_TABLET | Freq: Every day | ORAL | Status: DC
  Administered 2013-05-31 – 2013-06-01 (×2): 40 mg via ORAL
  Filled 2013-05-30 (×3): qty 2

## 2013-05-30 MED ORDER — ALBUTEROL (5 MG/ML) CONTINUOUS INHALATION SOLN: 15.0000 mg/h | INHALATION_SOLUTION | Freq: Once | RESPIRATORY_TRACT | Status: DC

## 2013-05-30 MED ORDER — ALBUTEROL (5 MG/ML) CONTINUOUS INHALATION SOLN
10.0000 mg/h | INHALATION_SOLUTION | RESPIRATORY_TRACT | Status: DC
  Administered 2013-05-30: 10 mg/h via RESPIRATORY_TRACT

## 2013-05-30 MED ORDER — LORATADINE 10 MG PO TABS
10.0000 mg | ORAL_TABLET | Freq: Every day | ORAL | Status: DC
  Administered 2013-05-30 – 2013-06-02 (×4): 10 mg via ORAL
  Filled 2013-05-30 (×4): qty 1

## 2013-05-30 MED ORDER — LISINOPRIL 20 MG PO TABS
20.0000 mg | ORAL_TABLET | Freq: Every day | ORAL | Status: DC
  Administered 2013-05-30 – 2013-06-02 (×4): 20 mg via ORAL
  Filled 2013-05-30 (×4): qty 1

## 2013-05-30 MED ORDER — CALCIUM CARBONATE ANTACID 500 MG PO CHEW
1.0000 | CHEWABLE_TABLET | Freq: Four times a day (QID) | ORAL | Status: DC | PRN
  Administered 2013-05-30: 200 mg via ORAL
  Administered 2013-05-31 – 2013-06-01 (×4): 400 mg via ORAL
  Filled 2013-05-30 (×4): qty 2

## 2013-05-30 MED ORDER — METHYLPREDNISOLONE SODIUM SUCC 125 MG IJ SOLR
125.0000 mg | Freq: Once | INTRAMUSCULAR | Status: AC
  Administered 2013-05-30: 125 mg via INTRAVENOUS
  Filled 2013-05-30: qty 2

## 2013-05-30 MED ORDER — HYDROCHLOROTHIAZIDE 25 MG PO TABS
25.0000 mg | ORAL_TABLET | Freq: Every day | ORAL | Status: DC
  Administered 2013-05-30 – 2013-06-02 (×4): 25 mg via ORAL
  Filled 2013-05-30 (×4): qty 1

## 2013-05-30 MED ORDER — IPRATROPIUM-ALBUTEROL 0.5-2.5 (3) MG/3ML IN SOLN
3.0000 mL | Freq: Four times a day (QID) | RESPIRATORY_TRACT | Status: DC
  Administered 2013-05-30 – 2013-06-02 (×12): 3 mL via RESPIRATORY_TRACT
  Filled 2013-05-30 (×12): qty 3

## 2013-05-30 MED ORDER — DOXYCYCLINE HYCLATE 100 MG PO TABS
100.0000 mg | ORAL_TABLET | Freq: Two times a day (BID) | ORAL | Status: DC
  Administered 2013-05-30 – 2013-06-02 (×7): 100 mg via ORAL
  Filled 2013-05-30 (×8): qty 1

## 2013-05-30 MED ORDER — IPRATROPIUM-ALBUTEROL 0.5-2.5 (3) MG/3ML IN SOLN
3.0000 mL | Freq: Once | RESPIRATORY_TRACT | Status: AC
  Administered 2013-05-30: 3 mL via RESPIRATORY_TRACT
  Filled 2013-05-30: qty 3

## 2013-05-30 MED ORDER — HEPARIN SODIUM (PORCINE) 5000 UNIT/ML IJ SOLN
5000.0000 [IU] | Freq: Three times a day (TID) | INTRAMUSCULAR | Status: DC
  Administered 2013-05-30 – 2013-06-02 (×9): 5000 [IU] via SUBCUTANEOUS
  Filled 2013-05-30 (×12): qty 1

## 2013-05-30 MED ORDER — FLUTICASONE PROPIONATE 50 MCG/ACT NA SUSP
2.0000 | Freq: Every day | NASAL | Status: DC
  Administered 2013-05-30 – 2013-06-02 (×4): 2 via NASAL
  Filled 2013-05-30 (×2): qty 16

## 2013-05-30 MED ORDER — MAGNESIUM SULFATE 40 MG/ML IJ SOLN
2.0000 g | Freq: Once | INTRAMUSCULAR | Status: AC
  Administered 2013-05-30: 2 g via INTRAVENOUS
  Filled 2013-05-30: qty 50

## 2013-05-30 MED ORDER — CHLORHEXIDINE GLUCONATE 0.12 % MT SOLN
15.0000 mL | Freq: Two times a day (BID) | OROMUCOSAL | Status: DC
  Administered 2013-05-30 – 2013-06-02 (×7): 15 mL via OROMUCOSAL
  Filled 2013-05-30 (×9): qty 15

## 2013-05-30 MED ORDER — SODIUM CHLORIDE 0.9 % IJ SOLN: 3.0000 mL | Freq: Two times a day (BID) | INTRAMUSCULAR | Status: DC

## 2013-05-30 MED ORDER — ALBUTEROL (5 MG/ML) CONTINUOUS INHALATION SOLN
10.0000 mg/h | INHALATION_SOLUTION | Freq: Once | RESPIRATORY_TRACT | Status: AC
  Administered 2013-05-30: 10 mg/h via RESPIRATORY_TRACT
  Filled 2013-05-30: qty 20

## 2013-05-30 MED ORDER — ALBUTEROL (5 MG/ML) CONTINUOUS INHALATION SOLN
INHALATION_SOLUTION | RESPIRATORY_TRACT | Status: AC
  Filled 2013-05-30: qty 20

## 2013-05-30 MED ORDER — POTASSIUM CHLORIDE CRYS ER 20 MEQ PO TBCR
20.0000 meq | EXTENDED_RELEASE_TABLET | Freq: Once | ORAL | Status: AC
  Administered 2013-05-30: 20 meq via ORAL
  Filled 2013-05-30: qty 1

## 2013-05-30 MED ORDER — LISINOPRIL-HYDROCHLOROTHIAZIDE 20-25 MG PO TABS: 1.0000 | ORAL_TABLET | Freq: Every day | ORAL | Status: DC

## 2013-05-30 MED ORDER — ALBUTEROL SULFATE (2.5 MG/3ML) 0.083% IN NEBU
2.5000 mg | INHALATION_SOLUTION | RESPIRATORY_TRACT | Status: DC | PRN
  Filled 2013-05-30: qty 3

## 2013-05-30 MED ORDER — SODIUM CHLORIDE 0.9 % IJ SOLN: 3.0000 mL | INTRAMUSCULAR | Status: DC | PRN

## 2013-05-30 MED ORDER — ALPRAZOLAM 0.25 MG PO TABS
0.2500 mg | ORAL_TABLET | Freq: Two times a day (BID) | ORAL | Status: DC | PRN
  Administered 2013-05-30 – 2013-06-01 (×5): 0.25 mg via ORAL
  Filled 2013-05-30 (×5): qty 1

## 2013-05-30 MED ORDER — ASPIRIN 300 MG RE SUPP: 300.0000 mg | RECTAL | Status: AC

## 2013-05-30 NOTE — ED Notes (Signed)
Patient also report using his neb 3 times in the last 12 hours along with spiriva.

## 2013-05-30 NOTE — Plan of Care (Signed)
Problem: Phase I Progression Outcomes Goal: Pain controlled Outcome: Progressing Denies pain. Goal: Progress activity as tolerated unless otherwise ordered Outcome: Not Progressing Dyspnea with minimal movement.

## 2013-05-30 NOTE — Progress Notes (Signed)
RT removed PT from BiPAP per PT request. PT states he is breathing better at this time. RN placed PT on 2 lpm [place].

## 2013-05-30 NOTE — ED Provider Notes (Signed)
CSN: [id number]     Arrival date & time 05/30/13  0258 History   First MD Initiated Contact with Patient 05/30/13 [phone]     Chief Complaint  Patient presents with  . Shortness of Breath     (Consider location/radiation/quality/duration/timing/severity/associated sxs/prior Treatment) HPI  58 year old male presents to emergency department from home with complaint of shortness of breath.  He reports shortness of breath started 5 days ago and has gotten progressively worse.  He reports orthopnea and dyspnea on exertion.  He has history of COPD, and may of been diagnosed with CHF.  Patient has been taking Flonase, Zyrtec and his inhaler/nebulizers without improvement in symptoms.  Patient has sleep apnea has been using his cPap.  Cough is nonproductive.  No fevers or chills.  Patient reports onset of chest pain tonight around 1 AM that prompted him to come to the emergency department.  Pain is left-sided, does not radiate.  Patient reports no significant extremity edema.  He reports that his belly is more bloated than usual. Past Medical History  Diagnosis Date  . Hypertension   . COPD (chronic obstructive pulmonary disease)   . Tachycardia   . Left ventricular hypoplasia   . OSA (obstructive sleep apnea)     on CPAP   . Chronic headaches   . Hyperlipidemia    History reviewed. No pertinent past surgical history. Family History  Problem Relation Age of Onset  . Colon cancer Father   . Heart disease Maternal Aunt    History  Substance Use Topics  . Smoking status: Current Every Day Smoker -- 1.00 packs/day for 40 years    Types: Cigarettes  . Smokeless tobacco: Never Used  . Alcohol Use: No    Review of Systems   See History of Present Illness; otherwise all other systems are reviewed and negative  Allergies  Codeine sulfate  Home Medications   Prior to Admission medications   Medication Sig Start Date End Date Taking? Authorizing Provider  albuterol (PROVENTIL HFA;VENTOLIN  HFA) 108 (90 BASE) MCG/ACT inhaler Inhale 2 puffs into the lungs every 6 (six) hours as needed for wheezing or shortness of breath.   Yes Historical Provider, MD  albuterol (PROVENTIL) (2.5 MG/3ML) 0.083% nebulizer solution Take 2.5 mg by nebulization every 6 (six) hours as needed for wheezing or shortness of breath.   Yes Historical Provider, MD  cetirizine (ZYRTEC) 10 MG tablet Take 10 mg by mouth daily as needed for allergies.    Yes Historical Provider, MD  fluticasone (FLONASE) 50 MCG/ACT nasal spray Place 2 sprays into the nose daily as needed for allergies or rhinitis.    Yes Historical Provider, MD  Fluticasone Furoate-Vilanterol (BREO ELLIPTA) 100-25 MCG/INH AEPB Inhale 1 puff into the lungs every evening.   Yes Historical Provider, MD  guaiFENesin (MUCINEX) 600 MG 12 hr tablet Take 600 mg by mouth 2 (two) times daily as needed for cough.    Yes Historical Provider, MD  guaiFENesin-dextromethorphan (ROBITUSSIN DM) 100-10 MG/5ML syrup Take 15 mLs by mouth every 6 (six) hours as needed for cough.   Yes Historical Provider, MD  ibuprofen (ADVIL,MOTRIN) 200 MG tablet Take 600 mg by mouth every 8 (eight) hours as needed for moderate pain.   Yes Historical Provider, MD  lisinopril-hydrochlorothiazide (PRINZIDE,ZESTORETIC) 20-25 MG per tablet Take 1 tablet by mouth daily. 11/20/12  Yes Lyn Records III, MD  tiotropium (SPIRIVA) 18 MCG inhalation capsule Place 18 mcg into inhaler and inhale every morning.    Yes Historical  Provider, MD   BP 130/85  Pulse 86  Temp(Src) 97.3 F (36.3 C) (Oral)  SpO2 98% Physical Exam  Nursing note and vitals reviewed. Constitutional: He is oriented to person, place, and time. He appears well-developed and well-nourished.  HENT:  Head: Normocephalic and atraumatic.  Nose: Nose normal.  Mouth/Throat: Oropharynx is clear and moist.  Eyes: Conjunctivae and EOM are normal. Pupils are equal, round, and reactive to light.  Neck: Normal range of motion. Neck supple.  No JVD present. No tracheal deviation present. No thyromegaly present.  Cardiovascular: Normal rate, regular rhythm, normal heart sounds and intact distal pulses.  Exam reveals no gallop and no friction rub.   No murmur heard. Pulmonary/Chest: No stridor. He is in respiratory distress (patient is in mild respiratory distress with tachypnea and short phrases with speaking.  He has poor air movement). He has no wheezes. He has no rales. He exhibits no tenderness.  Abdominal: Soft. Bowel sounds are normal. He exhibits no distension and no mass. There is no tenderness. There is no rebound and no guarding.  Musculoskeletal: Normal range of motion. He exhibits edema (trace in lower extremities). He exhibits no tenderness.  Lymphadenopathy:    He has no cervical adenopathy.  Neurological: He is alert and oriented to person, place, and time. He exhibits normal muscle tone. Coordination normal.  Skin: Skin is warm and dry. No rash noted. No erythema. No pallor.  Psychiatric: He has a normal mood and affect. His behavior is normal. Judgment and thought content normal.    ED Course  Procedures (including critical care time)  CRITICAL CARE Performed by: Olivia Mackielga M Bexton Haak Total critical care time: 90 min Critical care time was exclusive of separately billable procedures and treating other patients. Critical care was necessary to treat or prevent imminent or life-threatening deterioration. Critical care was time spent personally by me on the following activities: development of treatment plan with patient and/or surrogate as well as nursing, discussions with consultants, evaluation of patient's response to treatment, examination of patient, obtaining history from patient or surrogate, ordering and performing treatments and interventions, ordering and review of laboratory studies, ordering and review of radiographic studies, pulse oximetry and re-evaluation of patient's condition.  Labs Review Labs Reviewed   CBC - Abnormal; Notable for the following:    MCHC 36.4 (*)    Platelets 129 (*)    All other components within normal limits  COMPREHENSIVE METABOLIC PANEL - Abnormal; Notable for the following:    Potassium 3.3 (*)    Glucose, Bld 150 (*)    GFR calc non Af Amer 83 (*)    All other components within normal limits  PRO B NATRIURETIC PEPTIDE  TROPONIN I  BLOOD GAS, ARTERIAL  I-STAT TROPOININ, ED    Imaging Review Dg Chest Port 1 View  05/30/2013   CLINICAL DATA:  Shortness of breath  EXAM: PORTABLE CHEST - 1 VIEW  COMPARISON:  04/26/2012  FINDINGS: No cardiomegaly or upper mediastinal contour abnormality. There is pulmonary hyperinflation and chronic interstitial coarsening, at the patient's imaging baseline. No superimposed consolidation or edema. No effusion or pneumothorax.  IMPRESSION: Stable findings of COPD.  No suspected pneumonia or edema.   Electronically Signed   By: Tiburcio PeaJonathan  Watts M.D.   On: 05/30/2013 04:28     EKG Interpretation None      Date: 05/30/2013  Rate: 88  Rhythm: normal sinus rhythm  QRS Axis: normal  Intervals: normal  ST/T Wave abnormalities: normal  Conduction Disutrbances:none  Narrative Interpretation:   Old EKG Reviewed: none available    MDM   Final diagnoses:  COPD exacerbation  Respiratory distress    58 year old male with 5 days of dyspnea, now with chest pain.  Patient has history of COPD hypertension and possible history of CHF per his report.  Plan for chest x-ray, labs.  We'll give her a DuoNeb to see if that helps with air exchange.  Patient is O2 sats are normal this time.  We'll monitor closely for need for diuretics.  4:40 AM Pt reports resolution of chest pain.  Pt is moving air better, now has end expiratory wheezing.  I advised patient he would need admission given his respiratory status and chest pain for further management and treatment.  Pt refuses, saying his wife is disabled, and he is the only care giver.  Will give  hour neb, recheck troponin.  I am not comfortable discharging the patient, and he will need to sign out ama if he is insistent to leave.  6:05 AM Pt with worsening sob during hour neb, reported feeling like he could not breathe.  Diaphoretic, tachypenic.  Sats 93%, wheezing, moving air.  Now on Bipap, tripod position.  Receiving Mg.  6:37 AM Pt has now agreed to admission.  Still on bipap, receiving second continuous neb.  Still very tight, but is moving air slightly better.  6:44 AM D/w critical care for admission.  They request ABG.  Olivia Mackielga M Coraima Tibbs, MD 05/30/13 [phone]

## 2013-05-30 NOTE — ED Notes (Signed)
EKG given to EDP, Otter,MD. For review. 

## 2013-05-30 NOTE — ED Notes (Signed)
Pt alert, nad, resp even unlabored, skin pwd, denies needs 

## 2013-05-30 NOTE — ED Notes (Signed)
RT bedside, BiPap stopped, pt placed on [place] O2 @ 2L, tolerating well.  Cont to monitor

## 2013-05-30 NOTE — Progress Notes (Signed)
Handoff report received from [place], [place].

## 2013-05-30 NOTE — Progress Notes (Signed)
UR completed 

## 2013-05-30 NOTE — ED Notes (Signed)
Consulting provider bedside to evaluate patient

## 2013-05-30 NOTE — ED Notes (Signed)
Patient is from home c/o shortness of breath that started 5 days ago and progressively got worst tonight. Patient states he has had runny nose, heavy cough and chest congestion. Patient states he has been taking flonase and zyrtec daily. Patient states he felt febrile at home but never took temp. States he has had HA and chest pain due to cough. At this current time patient axox3 able to answer question but patient appears to be short of breath.

## 2013-05-30 NOTE — ED Notes (Signed)
RT called and made aware of order for continuous neb tx 

## 2013-05-30 NOTE — H&P (Addendum)
Name: James Kidd MRN: [id number] DOB: 01-21-1956    ADMISSION DATE:  05/30/2013  REFERRING MD :  EDP PRIMARY SERVICE:  PCCM  CHIEF COMPLAINT:  SOB  BRIEF PATIENT DESCRIPTION: 58 y/o M, heavy smoker, admitted on 5/8 with acute COPD exacerbation.    SIGNIFICANT EVENTS / STUDIES:  5/08 - Admit with AECOPD, trigger thought to be seasonal allergies  LINES / TUBES:  CULTURES:  ANTIBIOTICS: Doxycycline 5/8 >>  HISTORY OF PRESENT ILLNESS:  58 y/o M, current heavy smoker (hx 43 years 1-3 ppd), with a PMH of HTN, HLD, Tachycardia, Left Ventricular Hypoplasia, seasonal allergies & COPD (FVC 3.61 > 70%, FEV1 1.93 > 49%, post BD FEV 2.55 / 65% increase of 31%, FEV1/FVC 54 > 69%, DLCO 26.12 > 77%) who presented to [facility] on 5/8 with a one week history of worsening allergy symptoms. He reports increased shortness of breath, cough and yellow sputum production (increase from his baseline daily production).  Patient indicates he has been off his Flonase due to cost.   He denies fever (checked at home) but felt as though he had chills.  Cough has been frequent and "harsh" to the point that he reports "passing out spells" (witnessed event in ER - pt coughed and appeared momentarily dazed and then returned to normal, no change in vital signs during event).    Baseline activity:  Patient is able to climb 1 flight of stairs, would have to be walked very slowly and with breaks.  Does grocery shopping, can not push mow the yard (has not done so in 15 years).  He takes breaks with activity.  He works as a Biochemist, clinical.  Patient is primary caretaker of his wife who is disabled from chronic pain issues.     PAST MEDICAL HISTORY :  Past Medical History  Diagnosis Date  . Hypertension   . COPD (chronic obstructive pulmonary disease)   . Tachycardia   . Left ventricular hypoplasia   . OSA (obstructive sleep apnea)     on CPAP   . Chronic headaches   . Hyperlipidemia    History reviewed. No pertinent  past surgical history. Prior to Admission medications   Medication Sig Start Date End Date Taking? Authorizing Provider  albuterol (PROVENTIL HFA;VENTOLIN HFA) 108 (90 BASE) MCG/ACT inhaler Inhale 2 puffs into the lungs every 6 (six) hours as needed for wheezing or shortness of breath.   Yes Historical Provider, MD  albuterol (PROVENTIL) (2.5 MG/3ML) 0.083% nebulizer solution Take 2.5 mg by nebulization every 6 (six) hours as needed for wheezing or shortness of breath.   Yes Historical Provider, MD  cetirizine (ZYRTEC) 10 MG tablet Take 10 mg by mouth daily as needed for allergies.    Yes Historical Provider, MD  fluticasone (FLONASE) 50 MCG/ACT nasal spray Place 2 sprays into the nose daily as needed for allergies or rhinitis.    Yes Historical Provider, MD  Fluticasone Furoate-Vilanterol (BREO ELLIPTA) 100-25 MCG/INH AEPB Inhale 1 puff into the lungs every evening.   Yes Historical Provider, MD  guaiFENesin (MUCINEX) 600 MG 12 hr tablet Take 600 mg by mouth 2 (two) times daily as needed for cough.    Yes Historical Provider, MD  guaiFENesin-dextromethorphan (ROBITUSSIN DM) 100-10 MG/5ML syrup Take 15 mLs by mouth every 6 (six) hours as needed for cough.   Yes Historical Provider, MD  ibuprofen (ADVIL,MOTRIN) 200 MG tablet Take 600 mg by mouth every 8 (eight) hours as needed for moderate pain.   Yes Historical  Provider, MD  lisinopril-hydrochlorothiazide (PRINZIDE,ZESTORETIC) 20-25 MG per tablet Take 1 tablet by mouth daily. 11/20/12  Yes Lyn RecordsHenry W Smith III, MD  tiotropium (SPIRIVA) 18 MCG inhalation capsule Place 18 mcg into inhaler and inhale every morning.    Yes Historical Provider, MD   Allergies  Allergen Reactions  . Codeine Sulfate Itching    FAMILY HISTORY:  Family History  Problem Relation Age of Onset  . Colon cancer Father   . Heart disease Maternal Aunt    SOCIAL HISTORY:  reports that he has been smoking Cigarettes.  He has a 40 pack-year smoking history. He has never used  smokeless tobacco. He reports that he does not drink alcohol or use illicit drugs.  REVIEW OF SYSTEMS:   Constitutional: Negative for fever, weight loss, malaise/fatigue and diaphoresis.  Positive for chills.   HENT: Negative for hearing loss, ear pain, nosebleeds, congestion, sore throat, neck pain, tinnitus and ear discharge.   Eyes: Negative for blurred vision, double vision, photophobia, pain, discharge and redness.  Respiratory: Negative for hemoptysis, and stridor.  Positive for cough, sputum production, shortness of breath, wheezing.  See HPI for further details.  Cardiovascular: Negative for chest pain, palpitations, orthopnea, claudication, leg swelling and PND.  Gastrointestinal: Negative for heartburn, nausea, vomiting, abdominal pain, diarrhea, constipation, blood in stool and melena.  Genitourinary: Negative for dysuria, urgency, frequency, hematuria and flank pain.  Musculoskeletal: Negative for myalgias, back pain, joint pain and falls.  Skin: Negative for itching and rash.  Neurological: Negative for dizziness, tingling, tremors, sensory change, speech change, focal weakness, seizures, loss of consciousness, weakness and headaches.  Endo/Heme/Allergies: Negative for environmental allergies and polydipsia. Does not bruise/bleed easily.  SUBJECTIVE:  See HPI.   VITAL SIGNS: Temp:  [97.3 F (36.3 C)] 97.3 F (36.3 C) (05/08 0310) Pulse Rate:  [86-118] 107 (05/08 0800) Resp:  [15-29] 29 (05/08 0800) BP: (120-149)/(64-85) 120/64 mmHg (05/08 0800) SpO2:  [92 %-100 %] 95 % (05/08 0800) FiO2 (%):  [30 %] 30 % (05/08 0637)  PHYSICAL EXAMINATION: General:  Morbidly obese, appears SOB but no acute distress Neuro:  AAOx4, speech clear, MAE HEENT:  Mm pink/moist, no jvd, short thick neck Cardiovascular:  s1s2 rrr, no m/r/g, tachy Lungs:  Prolonged exp phase, lungs distant with decreased air movement, no wheeze Abdomen:  Obese, soft, bsx4 active.  Old umbilical surgical scar.     Musculoskeletal:  No acute deformities  Skin:  Warm/dry, trace LE edema   Recent Labs Lab 05/30/13 0314  NA 137  K 3.3*  CL 96  CO2 24  BUN 12  CREATININE 1.00  GLUCOSE 150*    Recent Labs Lab 05/30/13 0314  HGB 16.1  HCT 44.2  WBC 5.3  PLT 129*   Dg Chest Port 1 View  05/30/2013   CLINICAL DATA:  Shortness of breath  EXAM: PORTABLE CHEST - 1 VIEW  COMPARISON:  04/26/2012  FINDINGS: No cardiomegaly or upper mediastinal contour abnormality. There is pulmonary hyperinflation and chronic interstitial coarsening, at the patient's imaging baseline. No superimposed consolidation or edema. No effusion or pneumothorax.  IMPRESSION: Stable findings of COPD.  No suspected pneumonia or edema.   Electronically Signed   By: Tiburcio PeaJonathan  Watts M.D.   On: 05/30/2013 04:28    ASSESSMENT / PLAN:  COPD with Acute Exacerbation - not oxygen dependent OSA - on CPAP, nasal mask  Plan: Scheduled BD's + PRN albuterol  Flonase / Nasal Hygiene  Oxygen to support sats 88-94% Mucinex 125 mg Solumedrol in  ER --> to prednisone 40 QD Claritin Doxycycline PRN BiPAP, mandatory CPAP qhs. Cannot find his settings so will use auto-set device Ok for clear liquid diet Hold home Breo, sprivia CXR in am to ensure no interval development of infiltrate, then PRN   Tachycardia  HLD Hypertension Left Ventricular Hypoplasia   Plan: ASA Continue home HCTZ, Lisinopril   Hypokalemia   Plan: KCL 20 mEq x 1  Thrombocytopenia   Plan: Monitor CBC Caution with SQ heparin  Situational Anxiety - in setting of steroids, nebs & concern for wife's needs at home   Plan: -PRN xanax low dose (pt does not usually use anxiolytics)  GLOBAL: -pt very concerned regarding timing of discharge as he is primary caregiver for wife   Canary BrimBrandi Ollis, NP-C [facility] Pgr: [phone] or [phone] 05/30/2013, 8:46 AM  CC time 60 minutes  Levy Pupaobert Alquan Morrish, MD, PhD 05/30/2013, 10:58 AM [facility] [phone] or if no answer [phone]

## 2013-05-31 ENCOUNTER — Inpatient Hospital Stay (HOSPITAL_COMMUNITY): Payer: 59

## 2013-05-31 DIAGNOSIS — R0602 Shortness of breath: Secondary | ICD-10-CM

## 2013-05-31 DIAGNOSIS — J449 Chronic obstructive pulmonary disease, unspecified: Secondary | ICD-10-CM

## 2013-05-31 DIAGNOSIS — I5032 Chronic diastolic (congestive) heart failure: Secondary | ICD-10-CM

## 2013-05-31 LAB — CBC
HCT: 40.1 % (ref 39.0–52.0)
Hemoglobin: 14.6 g/dL (ref 13.0–17.0)
MCH: 30.8 pg (ref 26.0–34.0)
MCHC: 36.4 g/dL — ABNORMAL HIGH (ref 30.0–36.0)
MCV: 84.6 fL (ref 78.0–100.0)
Platelets: 159 10*3/uL (ref 150–400)
RBC: 4.74 MIL/uL (ref 4.22–5.81)
RDW: 13.1 % (ref 11.5–15.5)
WBC: 11.6 10*3/uL — ABNORMAL HIGH (ref 4.0–10.5)

## 2013-05-31 LAB — BASIC METABOLIC PANEL
BUN: 14 mg/dL (ref 6–23)
CHLORIDE: 97 meq/L (ref 96–112)
CO2: 25 meq/L (ref 19–32)
Calcium: 9.2 mg/dL (ref 8.4–10.5)
Creatinine, Ser: 0.95 mg/dL (ref 0.50–1.35)
GFR calc non Af Amer: >90 mL/min (ref >90)
Glucose, Bld: 161 mg/dL — ABNORMAL HIGH (ref 70–99)
POTASSIUM: 4.2 meq/L (ref 3.7–5.3)
SODIUM: 134 meq/L — AB (ref 137–147)

## 2013-05-31 LAB — PROCALCITONIN

## 2013-05-31 MED ORDER — FUROSEMIDE 10 MG/ML IJ SOLN
40.0000 mg | Freq: Two times a day (BID) | INTRAMUSCULAR | Status: DC
  Administered 2013-05-31 – 2013-06-01 (×3): 40 mg via INTRAVENOUS
  Filled 2013-05-31 (×4): qty 4

## 2013-05-31 NOTE — Progress Notes (Signed)
Patient says he wears a CPAP machine at home and knows how to use the hospital machine as needed. Patient is aware to call RT if he does need any help at all with the machine. RT will continue to assist as needed.

## 2013-05-31 NOTE — Progress Notes (Signed)
Name: James Kidd MRN: [id number] DOB: Dec 13, 1955    ADMISSION DATE:  05/30/2013  REFERRING MD :  EDP PRIMARY SERVICE:  PCCM  CHIEF COMPLAINT:  SOB  BRIEF PATIENT DESCRIPTION: 58 y/o M, heavy smoker, admitted on 5/8 with acute COPD exacerbation.    SIGNIFICANT EVENTS / STUDIES:  5/08 - Admit with AECOPD, trigger thought to be seasonal allergies 5/9- not intubated  LINES / TUBES:  CULTURES:  ANTIBIOTICS: Doxycycline 5/8 >>  SUBJECTIVE:  Resting on home cpap  VITAL SIGNS: Temp:  [97.6 F (36.4 C)-98.1 F (36.7 C)] 97.6 F (36.4 C) (05/09 0353) Pulse Rate:  [57-119] 57 (05/09 0429) Resp:  [14-30] 30 (05/09 0040) BP: (92-147)/(50-76) 108/50 mmHg (05/09 0429) SpO2:  [88 %-99 %] 95 % (05/09 0730) Weight:  [121.3 kg (267 lb 6.7 oz)] 121.3 kg (267 lb 6.7 oz) (05/08 1000)  PHYSICAL EXAMINATION: General:  Morbidly obese, resting on cpap , no distress Neuro:  AAOx4, speech clear, nonfocal  HEENT:  Mm pink/moist, no jvd, short thick neck Cardiovascular:  s1s2 rrr, hr is 90 Lungs:  distant Abdomen:  Obese, soft, bsx4 active.  Old umbilical surgical scar.   Musculoskeletal:  No acute deformities  Skin:  Warm/dry, trace LE edema   Recent Labs Lab 05/30/13 0314 05/31/13 0316  NA 137 134*  K 3.3* 4.2  CL 96 97  CO2 24 25  BUN 12 14  CREATININE 1.00 0.95  GLUCOSE 150* 161*    Recent Labs Lab 05/30/13 0314 05/31/13 0316  HGB 16.1 14.6  HCT 44.2 40.1  WBC 5.3 11.6*  PLT 129* 159   Dg Chest Port 1 View  05/31/2013   CLINICAL DATA:  Evaluate for infiltrate.  Hypertension.  EXAM: PORTABLE CHEST - 1 VIEW  COMPARISON:  05/30/2013  FINDINGS: Pulmonary hyperinflation and chronic interstitial coarsening. No superimposed pneumonia or edema. No effusion or pneumothorax. Normal heart size and stable upper mediastinal contours.  IMPRESSION: COPD with chronic bronchitic markings. No superimposed pneumonia or edema.   Electronically Signed   By: James PeaJonathan  Kidd M.D.   On:  05/31/2013 06:44   Dg Chest Port 1 View  05/30/2013   CLINICAL DATA:  Shortness of breath  EXAM: PORTABLE CHEST - 1 VIEW  COMPARISON:  04/26/2012  FINDINGS: No cardiomegaly or upper mediastinal contour abnormality. There is pulmonary hyperinflation and chronic interstitial coarsening, at the patient's imaging baseline. No superimposed consolidation or edema. No effusion or pneumothorax.  IMPRESSION: Stable findings of COPD.  No suspected pneumonia or edema.   Electronically Signed   By: James PeaJonathan  Kidd M.D.   On: 05/30/2013 04:28    ASSESSMENT / PLAN:  COPD with Acute Exacerbation - not oxygen dependent OSA - on CPAP, nasal mask Degree of edema? No evidence ischemia asess pa pressure contribution  Plan: Scheduled BD's + PRN albuterol  Flonase / Nasal Hygiene  Oxygen to support sats 88-94%, assess with baseline ambulation Mucinex Maintain prednisone 40 QD Claritin Doxycycline, add pct to limit duration PRN BiPAP not needed cpap nocturnal home Ok for clear liquid diet, advance Hold home Breo, sprivia CXR chronic int changes, some edema on top>?, lasix Echo for pa pressure contribution Lytes in am, with lasix start  Tachycardia resolved HLD Hypertension Left Ventricular Hypoplasia   Plan: ASA Continue home HCTZ, Lisinopril  Situational Anxiety - in setting of steroids, nebs & concern for wife's needs at home   Plan: -PRN xanax low dose (pt does not usually use anxiolytics)  GLOBAL: -better, lasix, echo, consider  transfer, no BIPAP needed, using nocturnal cpap  Mcarthur Rossettianiel J. Tyson AliasFeinstein, MD, FACP Pgr: [phone] [facility]

## 2013-05-31 NOTE — Progress Notes (Signed)
Handoff report called to Lupita Leashonna, Charity fundraiserN.

## 2013-05-31 NOTE — Progress Notes (Signed)
Echocardiogram 2D Echocardiogram has been performed.  James GrumblesMelissa Kidd James Kidd 05/31/2013, 11:50 AM

## 2013-06-01 ENCOUNTER — Inpatient Hospital Stay (HOSPITAL_COMMUNITY): Payer: 59

## 2013-06-01 LAB — COMPREHENSIVE METABOLIC PANEL
ALBUMIN: 3.4 g/dL — AB (ref 3.5–5.2)
ALK PHOS: 60 U/L (ref 39–117)
ALT: 34 U/L (ref 0–53)
AST: 33 U/L (ref 0–37)
BUN: 22 mg/dL (ref 6–23)
CO2: 29 mEq/L (ref 19–32)
Calcium: 9.1 mg/dL (ref 8.4–10.5)
Chloride: 96 mEq/L (ref 96–112)
Creatinine, Ser: 1.04 mg/dL (ref 0.50–1.35)
GFR calc Af Amer: >90 mL/min (ref >90)
GFR calc non Af Amer: 79 mL/min — ABNORMAL LOW (ref >90)
Glucose, Bld: 116 mg/dL — ABNORMAL HIGH (ref 70–99)
POTASSIUM: 3.4 meq/L — AB (ref 3.7–5.3)
SODIUM: 139 meq/L (ref 137–147)
Total Bilirubin: 0.3 mg/dL (ref 0.3–1.2)
Total Protein: 6.5 g/dL (ref 6.0–8.3)

## 2013-06-01 LAB — PROCALCITONIN: Procalcitonin: <0.1 ng/mL

## 2013-06-01 LAB — CBC WITH DIFFERENTIAL/PLATELET
BASOS ABS: 0 10*3/uL (ref 0.0–0.1)
BASOS PCT: 0 % (ref 0–1)
EOS PCT: 0 % (ref 0–5)
Eosinophils Absolute: 0 10*3/uL (ref 0.0–0.7)
HCT: 41.9 % (ref 39.0–52.0)
Hemoglobin: 15 g/dL (ref 13.0–17.0)
Lymphocytes Relative: 13 % (ref 12–46)
Lymphs Abs: 1.4 10*3/uL (ref 0.7–4.0)
MCH: 31.1 pg (ref 26.0–34.0)
MCHC: 35.8 g/dL (ref 30.0–36.0)
MCV: 86.9 fL (ref 78.0–100.0)
MONO ABS: 0.9 10*3/uL (ref 0.1–1.0)
Monocytes Relative: 8 % (ref 3–12)
Neutro Abs: 8.3 10*3/uL — ABNORMAL HIGH (ref 1.7–7.7)
Neutrophils Relative %: 78 % — ABNORMAL HIGH (ref 43–77)
Platelets: 164 10*3/uL (ref 150–400)
RBC: 4.82 MIL/uL (ref 4.22–5.81)
RDW: 13.4 % (ref 11.5–15.5)
WBC: 10.6 10*3/uL — ABNORMAL HIGH (ref 4.0–10.5)

## 2013-06-01 MED ORDER — POTASSIUM CHLORIDE CRYS ER 20 MEQ PO TBCR
40.0000 meq | EXTENDED_RELEASE_TABLET | Freq: Once | ORAL | Status: AC
  Administered 2013-06-01: 40 meq via ORAL
  Filled 2013-06-01: qty 2

## 2013-06-01 MED ORDER — PREDNISONE 20 MG PO TABS
20.0000 mg | ORAL_TABLET | Freq: Every day | ORAL | Status: DC
  Administered 2013-06-02: 20 mg via ORAL
  Filled 2013-06-01 (×2): qty 1

## 2013-06-01 NOTE — Progress Notes (Signed)
   Name: James Kidd MRN: [id number] DOB: 1955/07/17    ADMISSION DATE:  05/30/2013  REFERRING MD :  EDP PRIMARY SERVICE:  PCCM  CHIEF COMPLAINT:  SOB  BRIEF PATIENT DESCRIPTION: 58 y/o M, heavy smoker, admitted on 5/8 with acute COPD exacerbation.    SIGNIFICANT EVENTS / STUDIES:  5/08 - Admit with AECOPD, trigger thought to be seasonal allergies 5/9- not intubated  LINES / TUBES:  CULTURES:  ANTIBIOTICS: Doxycycline 5/8 >>  SUBJECTIVE:  Stable overnight.  Feels he is 70% better and nearing baseline.  VITAL SIGNS: Temp:  [97.5 F (36.4 C)-98.1 F (36.7 C)] 97.5 F (36.4 C) (05/10 0652) Pulse Rate:  [83-102] 83 (05/10 0652) Resp:  [18-20] 18 (05/10 0652) BP: (114-128)/(67-83) 128/83 mmHg (05/10 0652) SpO2:  [94 %-96 %] 94 % (05/10 0920) Weight:  [119.704 kg (263 lb 14.4 oz)] 119.704 kg (263 lb 14.4 oz) (05/10 [id number])  PHYSICAL EXAMINATION: General:  Morbidly obese, no increased wob Neuro:  Alert, moves all 4  HEENT: nose without purulence, neck without LN or TMG Cardiovascular:  Regular but mild tachy Lungs:  Decreased bs, but adequate airflow.  No wheezing Abdomen:  Obese, soft,NT, BS+ LE with mild edema, no cyanosis   Recent Labs Lab 05/30/13 0314 05/31/13 0316 06/01/13 0441  NA 137 134* 139  K 3.3* 4.2 3.4*  CL 96 97 96  CO2 24 25 29   BUN 12 14 22   CREATININE 1.00 0.95 1.04  GLUCOSE 150* 161* 116*    Recent Labs Lab 05/30/13 0314 05/31/13 0316 06/01/13 0441  HGB 16.1 14.6 15.0  HCT 44.2 40.1 41.9  WBC 5.3 11.6* 10.6*  PLT 129* 159 164   Dg Chest Port 1 View  06/01/2013   CLINICAL DATA:  Assess edema  EXAM: PORTABLE CHEST - 1 VIEW  COMPARISON:  05/31/2013  FINDINGS: Mild cardiomegaly which is stable. Unchanged upper mediastinal contours. Diffuse interstitial coarsening which is stable over multiple previous examinations, likely related to COPD. No consolidation, effusion, or pneumothorax.  IMPRESSION: Stable appearance of the chest. There is  COPD without evidence of superimposed acute disease.   Electronically Signed   By: Tiburcio PeaJonathan  Watts M.D.   On: 06/01/2013 05:32   Dg Chest Port 1 View  05/31/2013   CLINICAL DATA:  Evaluate for infiltrate.  Hypertension.  EXAM: PORTABLE CHEST - 1 VIEW  COMPARISON:  05/30/2013  FINDINGS: Pulmonary hyperinflation and chronic interstitial coarsening. No superimposed pneumonia or edema. No effusion or pneumothorax. Normal heart size and stable upper mediastinal contours.  IMPRESSION: COPD with chronic bronchitic markings. No superimposed pneumonia or edema.   Electronically Signed   By: Tiburcio PeaJonathan  Watts M.D.   On: 05/31/2013 06:44    ASSESSMENT / PLAN:  COPD with Acute Exacerbation - not oxygen dependent OSA - on CPAP, nasal mask Pt is much improved.  Question raised about cardiac component.  Echo with very poor acoustic window, LV and RV appeared ok, couldn't eval PA pressures Lasix d/ced due to increased BUN/creat.  Plan: -continue BD - taper steroids aggressively -mobilize -?home in am.

## 2013-06-01 NOTE — Progress Notes (Addendum)
Spoke with pt regarding cpap for rest.  Pt states he can place it on himself when ready and has been doing so while in the hospital.  Previous cpap settings noted at 8cm h2o, which the pt states feels comfortable to him.  Pt refused sterile water for humidity chamber.  Pt was advised that RT is available all night should he need further assistance.

## 2013-06-02 ENCOUNTER — Telehealth: Payer: Self-pay | Admitting: Emergency Medicine

## 2013-06-02 ENCOUNTER — Encounter: Payer: Self-pay | Admitting: Internal Medicine

## 2013-06-02 DIAGNOSIS — J9600 Acute respiratory failure, unspecified whether with hypoxia or hypercapnia: Secondary | ICD-10-CM

## 2013-06-02 LAB — BASIC METABOLIC PANEL
BUN: 19 mg/dL (ref 6–23)
CHLORIDE: 96 meq/L (ref 96–112)
CO2: 28 mEq/L (ref 19–32)
CREATININE: 0.88 mg/dL (ref 0.50–1.35)
Calcium: 9.8 mg/dL (ref 8.4–10.5)
GFR calc Af Amer: >90 mL/min (ref >90)
Glucose, Bld: 119 mg/dL — ABNORMAL HIGH (ref 70–99)
Potassium: 3.5 mEq/L — ABNORMAL LOW (ref 3.7–5.3)
Sodium: 136 mEq/L — ABNORMAL LOW (ref 137–147)

## 2013-06-02 LAB — PROCALCITONIN: Procalcitonin: <0.1 ng/mL

## 2013-06-02 MED ORDER — HYDROCHLOROTHIAZIDE 25 MG PO TABS: 25.0000 mg | ORAL_TABLET | Freq: Every day | ORAL | Status: AC

## 2013-06-02 MED ORDER — PREDNISONE 20 MG PO TABS: ORAL_TABLET | ORAL | Status: DC

## 2013-06-02 MED ORDER — SALINE SPRAY 0.65 % NA SOLN: 1.0000 | NASAL | Status: DC | PRN

## 2013-06-02 MED ORDER — DOXYCYCLINE HYCLATE 100 MG PO TABS: 100.0000 mg | ORAL_TABLET | Freq: Two times a day (BID) | ORAL | Status: DC

## 2013-06-02 MED ORDER — OLMESARTAN MEDOXOMIL 20 MG PO TABS: 20.0000 mg | ORAL_TABLET | Freq: Every day | ORAL | Status: DC

## 2013-06-02 NOTE — Progress Notes (Signed)
[facility]   [facility] the [facility]: Oxygen  If patient discharges after hours, please call [phone].   [name] 06/02/2013, 12:06 PM
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
## 2013-06-02 NOTE — Telephone Encounter (Signed)
James Kidd at # listed. Phone rang several times and no answer and no VM WCB Pt currently admitted

## 2013-06-02 NOTE — Discharge Instructions (Signed)
Chronic Obstructive Pulmonary Disease  Chronic obstructive pulmonary disease (COPD) is a common lung condition in which airflow from the lungs is limited. COPD is a general term that can be used to describe many different lung problems that limit airflow, including both chronic bronchitis and emphysema.  If you have COPD, your lung function will probably never return to normal, but there are measures you can take to improve lung function and make yourself feel better.   CAUSES   · Smoking (common).    · Exposure to secondhand smoke.    · Genetic problems.  · Chronic inflammatory lung diseases or recurrent infections.  SYMPTOMS   · Shortness of breath, especially with physical activity.    · Deep, persistent (chronic) cough with a large amount of thick mucus.    · Wheezing.    · Rapid breaths (tachypnea).    · Gray or bluish discoloration (cyanosis) of the skin, especially in fingers, toes, or lips.    · Fatigue.    · Weight loss.    · Frequent infections or episodes when breathing symptoms become much worse (exacerbations).    · Chest tightness.  DIAGNOSIS   Your healthcare provider will take a medical history and perform a physical examination to make the initial diagnosis.  Additional tests for COPD may include:   · Lung (pulmonary) function tests.  · Chest X-ray.  · CT scan.  · Blood tests.  TREATMENT   Treatment available to help you feel better when you have COPD include:   · Inhaler and nebulizer medicines. These help manage the symptoms of COPD and make your breathing more comfortable  · Supplemental oxygen. Supplemental oxygen is only helpful if you have a low oxygen level in your blood.    · Exercise and physical activity. These are beneficial for nearly all people with COPD. Some people may also benefit from a pulmonary rehabilitation program.  HOME CARE INSTRUCTIONS   · Take all medicines (inhaled or pills) as directed by your health care provider.  · Only take over-the-counter or prescription medicines  for pain, fever, or discomfort as directed by your health care provider.    · Avoid over-the-counter medicines or cough syrups that dry up your airway (such as antihistamines) and slow down the elimination of secretions unless instructed otherwise by your healthcare provider.    · If you are a smoker, the most important thing that you can do is stop smoking. Continuing to smoke will cause further lung damage and breathing trouble. Ask your health care provider for help with quitting smoking. He or she can direct you to community resources or hospitals that provide support.  · Avoid exposure to irritants such as smoke, chemicals, and fumes that aggravate your breathing.  · Use oxygen therapy and pulmonary rehabilitation if directed by your health care provider. If you require home oxygen therapy, ask your healthcare provider whether you should purchase a pulse oximeter to measure your oxygen level at home.    · Avoid contact with individuals who have a contagious illness.  · Avoid extreme temperature and humidity changes.  · Eat healthy foods. Eating smaller, more frequent meals and resting before meals may help you maintain your strength.  · Stay active, but balance activity with periods of rest. Exercise and physical activity will help you maintain your ability to do things you want to do.  · Preventing infection and hospitalization is very important when you have COPD. Make sure to receive all the vaccines your health care provider recommends, especially the pneumococcal and influenza vaccines. Ask your healthcare provider whether you   need a pneumonia vaccine.  · Learn and use relaxation techniques to manage stress.  · Learn and use controlled breathing techniques as directed by your health care provider. Controlled breathing techniques include:    · Pursed lip breathing. Start by breathing in (inhaling) through your nose for 1 second. Then, purse your lips as if you were going to whistle and breathe out (exhale)  through the pursed lips for 2 seconds.    · Diaphragmatic breathing. Start by putting one hand on your abdomen just above your waist. Inhale slowly through your nose. The hand on your abdomen should move out. Then purse your lips and exhale slowly. You should be able to feel the hand on your abdomen moving in as you exhale.    · Learn and use controlled coughing to clear mucus from your lungs. Controlled coughing is a series of short, progressive coughs. The steps of controlled coughing are:    1. Lean your head slightly forward.    2. Breathe in deeply using diaphragmatic breathing.    3. Try to hold your breath for 3 seconds.    4. Keep your mouth slightly open while coughing twice.    5. Spit any mucus out into a tissue.    6. Rest and repeat the steps once or twice as needed.  SEEK MEDICAL CARE IF:   · You are coughing up more mucus than usual.    · There is a change in the color or thickness of your mucus.    · Your breathing is more labored than usual.    · Your breathing is faster than usual.    SEEK IMMEDIATE MEDICAL CARE IF:   · You have shortness of breath while you are resting.    · You have shortness of breath that prevents you from:  · Being able to talk.    · Performing your usual physical activities.    · You have chest pain lasting longer than 5 minutes.    · Your skin color is more cyanotic than usual.  · You measure low oxygen saturations for longer than 5 minutes with a pulse oximeter.  MAKE SURE YOU:   · Understand these instructions.  · Will watch your condition.  · Will get help right away if you are not doing well or get worse.  Document Released: 10/19/2004 Document Revised: 10/30/2012 Document Reviewed: 09/05/2012  [employer]® Patient Information ©2014 [employer].

## 2013-06-02 NOTE — Progress Notes (Addendum)
SATURATION QUALIFICATIONS: (This note is used to comply with regulatory documentation for home oxygen)  Patient Saturations on Room Air at Rest = 91%  Patient Saturations on Room Air while Ambulating = 87%   Please briefly explain why patient needs home oxygen:Pre-ambulation saturations = 91% on RA Ambulatory saturations = sats dropped to 87%   Canary BrimBrandi Ollis, NP-C [facility] Pgr: [phone] or [phone]

## 2013-06-02 NOTE — Discharge Summary (Signed)
Physician Discharge Summary  Patient ID: James Kidd MRN: [id number] DOB/AGE: [birth year] 58 y.o.  Admit date: 05/30/2013 Discharge date: 06/02/2013    Discharge Diagnoses:  COPD with Acute Exacerbation  OSA - on CPAP, nasal mask Cough - with pre-syncope symptoms  Seasonal Allergies  Hypoxemia  Tachycardia  HLD  Hypertension  Left Ventricular Hypoplasia  Hypokalemia  Thrombocytopenia  Situational Anxiety                                                                          DISCHARGE PLAN BY DIAGNOSIS     COPD with Acute Exacerbation - not oxygen dependent  OSA - on CPAP, nasal mask  Cough with Pre-Syncope Seasonal Allergies  Hypoxemia  Discharge Plan:  - Resume Breo, spiriva - PRN albuterol MDI + Neb available at home - oxygen 2L per [place] with activity & QHS with CPAP - prednisone taper to off - total 5 days abx, coxycycline  - continue zyrtec, flonase  - smoking cessation    Tachycardia  HLD  Hypertension  Left Ventricular Hypoplasia   Discharge Plan: ASA  - Changed lisinopril/HCTZ to benicar 20 mg / HCTZ 25 secondary to cough - follow up with Dr. Melford Aase for hospital follow up and BP review   Hypokalemia   Discharge Plan: - Consider repeat BMP at hospital follow up  Recent Labs Lab 05/30/13 0314 05/31/13 0316 06/01/13 0441 06/02/13 0455  K 3.3* 4.2 3.4* 3.5*    Thrombocytopenia   Discharge Plan: - Resolved, no further outpatient follow up at this time.    Situational Anxiety - in setting of steroids, nebs & concern for wife's needs at home   Discharge Plan: - No Rx at discharge                   James Kidd is a 58 y.o. y/o male, current heavy smoker (hx 43 years 1-3 ppd), with a PMH of HTN, HLD (not on medication), Tachycardia, Left Ventricular Hypoplasia, seasonal allergies & COPD (FVC 3.61 > 70%, FEV1 1.93 > 49%, post BD FEV 2.55 / 65% increase of 31%, FEV1/FVC 54 > 69%, DLCO 26.12 > 77%) who  presented to [facility] on 5/8 with a one week history of worsening allergy symptoms while not taking his flonase (has not purchased as OTC). He reports increased shortness of breath, cough and yellow sputum production (increase from his baseline daily production). Patient indicated he had been off his Flonase due to cost. He denied fever (checked at home) but felt as though he had chills. Cough has been frequent and "harsh" to the point that he reports "passing out spells" (I witnessed event in ER - pt coughed and appeared momentarily dazed and then returned to normal, no change in vital signs during event, cardiac rhythm but patient would not answer during event, no LOC).  He reports he has had these episodes for 15 years.     Baseline activity: Patient is able to climb 1 flight of stairs, would have to be walked very slowly and with breaks. Does grocery shopping, can not push mow the yard (has not done so in 15 years). He takes breaks with activity. 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 Patient is primary caretaker of his wife who is disabled from chronic pain issues.  Patient was admitted to [facility] on 05/30/13 for acute exacerbation of COPD thought related to uncontrolled seasonal allergies / medication non-compliance & ongoing tobacco abuse. He was treated with doxycyline, nebulized bronchodilators, steroids and PRN BiPAP.  He improved and was transferred out of SDU to medical floor.  Patient tolerated auto-set CPAP (as home settings were unable to be determined while inpatient).  ECHO evaluated and unfortunately was a technically limited study - overall LV/RV function are good, no significant TR jet (argues against PAH), EF appears to be within normal limits.  CXR negative for acute infiltrate / edema.  EKG noted NSR. Procalcitionin & troponin were both cycled and negative.  He had mild hypokalemia on admission which was replaced.  In the setting of his "passing out spells" with cough, ACE-I was changed to ARB  to eliminate possible source of cough irritant.  Patient was ambulated prior to discharge to assess for oxygen needs and desaturated to 87% with activity.  See discharge details above.                SIGNIFICANT DIAGNOSTIC STUDIES 5/08 - Admit with AECOPD, trigger thought to be seasonal allergies  5/09 - Feels 70% better, nearing baseline, no acute events 5/09 - ECHO >> technically limited study - overall LV/RV function are good, no significant TR jet (argues against PAH), EF appears to be within normal limits.   MICRO DATA  5/8 - MRSA PCR >> neg  ANTIBIOTICS: Doxycycline 5/8 >>5/12   Discharge Exam: General: morbidly obese male in NAD Neuro: AAOx4, speech clear, MAE CV: s1s2 rrr, tachy, without m/r/g, pulses strong bilaterally PULM: resp's with prolonged exp phase, lungs bilaterally with good air movement, no bronchospasm GI: obese, soft, bsx4 active  Extremities: warm/dry, no edema   Filed Vitals:   06/02/13 0138 06/02/13 0619 06/02/13 0715 06/02/13 0820  BP:  122/72 136/92   Pulse:  85 106   Temp:  97.5 F (36.4 C) 97.5 F (36.4 C)   TempSrc:  Oral Oral   Resp:  18 20   Height:      Weight:  264 lb (119.75 kg)    SpO2: 92% 92% 94% 92%     Discharge Labs  BMET  Recent Labs Lab 05/30/13 0314 05/31/13 0316 06/01/13 0441 06/02/13 0455  NA 137 134* 139 136*  K 3.3* 4.2 3.4* 3.5*  CL 96 97 96 96  CO2 $Re'24 25 29 28  'EJQ$ GLUCOSE 150* 161* 116* 119*  BUN $Re'12 14 22 19  'fGg$ CREATININE 1.00 0.95 1.04 0.88  CALCIUM 9.1 9.2 9.1 9.8   CBC  Recent Labs Lab 05/30/13 0314 05/31/13 0316 06/01/13 0441  HGB 16.1 14.6 15.0  HCT 44.2 40.1 41.9  WBC 5.3 11.6* 10.6*  PLT 129* 159 164      Medication List    STOP taking these medications       lisinopril-hydrochlorothiazide 20-25 MG per tablet  Commonly known as:  PRINZIDE,ZESTORETIC     MUCINEX 600 MG 12 hr tablet  Generic drug:  guaiFENesin      TAKE these medications       albuterol 108 (90 BASE) MCG/ACT inhaler   Commonly known as:  PROVENTIL HFA;VENTOLIN HFA  Inhale 2 puffs into the lungs every 6 (six) hours as needed for wheezing or shortness of breath.     albuterol (2.5 MG/3ML) 0.083% nebulizer solution  Commonly known as:  PROVENTIL  Take 2.5 mg by nebulization every 6 (six) hours as needed for wheezing or shortness of breath.     BREO ELLIPTA 100-25 MCG/INH Aepb  Generic drug:  Fluticasone Furoate-Vilanterol  Inhale 1 puff into the lungs every evening.     cetirizine 10 MG tablet  Commonly known as:  ZYRTEC  Take 10 mg by mouth daily as needed for allergies.     doxycycline 100 MG tablet  Commonly known as:  VIBRA-TABS  Take 1 tablet (100 mg total) by mouth every 12 (twelve) hours.     fluticasone 50 MCG/ACT nasal spray  Commonly known as:  FLONASE  Place 2 sprays into the nose daily as needed for allergies or rhinitis.     guaiFENesin-dextromethorphan 100-10 MG/5ML syrup  Commonly known as:  ROBITUSSIN DM  Take 15 mLs by mouth every 6 (six) hours as needed for cough.     hydrochlorothiazide 25 MG tablet  Commonly known as:  HYDRODIURIL  Take 1 tablet (25 mg total) by mouth daily.     ibuprofen 200 MG tablet  Commonly known as:  ADVIL,MOTRIN  Take 600 mg by mouth every 8 (eight) hours as needed for moderate pain.     olmesartan 20 MG tablet  Commonly known as:  BENICAR  Take 1 tablet (20 mg total) by mouth daily.     predniSONE 20 MG tablet  Commonly known as:  DELTASONE  1 tab daily for 2 days, then 1/2 tab daily for 2 days then stop     sodium chloride 0.65 % Soln nasal spray  Commonly known as:  OCEAN  Place 1 spray into both nostrils as needed for congestion.     tiotropium 18 MCG inhalation capsule  Commonly known as:  SPIRIVA  Place 18 mcg into inhaler and inhale every morning.        Follow-up Information   Follow up with Chesley Noon, MD On 06/04/2013. (Appt at 3:30 PM )    Specialty:  Family Medicine   Contact information:   [place] [address] [phone]       Follow up with Christinia Gully, MD On 06/09/2013. (Appt at 9:45 )    Specialty:  Pulmonary Disease   Contact information:   [address] [address] [phone]        Disposition: Discharge to Home.  Arranged for 2L O2 with activity & QHS with CPAP.   Discharged Condition: James Kidd has met maximum benefit of inpatient care and is medically stable and cleared for discharge.  Patient is pending follow up as above.      Time spent on disposition:  Greater than 35 minutes.    CC: Dr's. [facility]ert & [facility]  Signed: Noe Gens, NP-C [facility] Pgr: [phone] Office: [phone]    Independently examined pt, evaluated data & formulated above discharge care plan with NP who scribed this note & edited by me.  Rigoberto Noel

## 2013-06-09 ENCOUNTER — Encounter: Payer: Self-pay | Admitting: Internal Medicine

## 2013-06-09 ENCOUNTER — Ambulatory Visit (INDEPENDENT_AMBULATORY_CARE_PROVIDER_SITE_OTHER): Payer: 59 | Admitting: Internal Medicine

## 2013-06-09 VITALS — BP 118/70 | HR 91 | Temp 98.7°F | Ht 70.0 in | Wt 277.0 lb

## 2013-06-09 DIAGNOSIS — R059 Cough, unspecified: Secondary | ICD-10-CM

## 2013-06-09 DIAGNOSIS — J9610 Chronic respiratory failure, unspecified whether with hypoxia or hypercapnia: Secondary | ICD-10-CM

## 2013-06-09 DIAGNOSIS — J449 Chronic obstructive pulmonary disease, unspecified: Secondary | ICD-10-CM

## 2013-06-09 DIAGNOSIS — R05 Cough: Secondary | ICD-10-CM

## 2013-06-09 DIAGNOSIS — I10 Essential (primary) hypertension: Secondary | ICD-10-CM

## 2013-06-09 DIAGNOSIS — R058 Other specified cough: Secondary | ICD-10-CM

## 2013-06-09 NOTE — Progress Notes (Signed)
Subjective:    Patient ID: James Kidd, male    DOB: 1955/12/07    MRN: [id number]   Brief patient profile:  12 yowm smoker dx copd by [facility] referred by James Kidd for re-evaluation with GOLD II criteria as of 05/2012     History of Present Illness  04/26/2012 1st pulmonary cc indolent onset x > 10 year sob progressed to point where can still walk distances like the mall but has to be slow and flat.  Ok at rest. Has to sleep at 30 degrees due to breathing and then episodes where much worse, esp with cold wet weather,  Assoc with daily dry cough so hard passes out occasionally but frequently feels presyncope with cough assoc with midline chest discomfort. Take daily spiriva and lots of saba but doesn't think it helps much. Prednisone helped a lot the last time (2 months prior to OV  He took it. rec Take delsym two tsp every 12 hours and supplement if needed with  tramadol 50 mg up to 2 every 4 hours to suppress the urge to cough. Swallowing water or using ice chips/non mint and menthol containing candies (such as lifesavers or sugarless jolly ranchers) are also effective.  You should rest your voice and avoid activities that you know make you cough. Once you have eliminated the cough for 3 straight days try reducing the tramadol first,  then the delsym as tolerated.   Stop spiriva and take symbicort 160 Take 2 puffs first thing in am and then another 2 puffs about 12 hours later.  Pantoprazole (protonix) 40 mg   Take 30-60 min before first meal of the day and Pepcid 20 mg one bedtime until return to office - this is the best way to tell whether stomach acid is contributing to your problem.   Only use your albuterol(proventil)  as a rescue medication   06/07/2012 f/u ov/James Kidd f/u copd GOLD II / still smoking  Chief Complaint  Patient presents with  . Followup with PFT    Pt states his breathing is about the same, no better or worse since the last visit. No new co's today.   still has sense of  chest congestion day = night but cough severity is better, min mucoid sputum, doe so still using saba more than twice daily  rec You only have mild copd which is likely to stabilize where it is if your stop smoking now, which will also help your cough Continue symbicort Take 2 puffs first thing in am and then another 2 puffs about 12 hours later.  Only use your albuterol (proventil)as a rescue medication to be used if you can't catch your breath by resting or doing a relaxed purse lip breathing pattern. The less you use it, the better it will work when you need it.   Admit date: 05/30/2013  Discharge date: 06/02/2013  Discharge Diagnoses:  COPD with Acute Exacerbation  OSA - on CPAP, nasal mask  Cough - with pre-syncope symptoms  Seasonal Allergies  Hypoxemia  Tachycardia  HLD  Hypertension  Left Ventricular Hypoplasia  Hypokalemia  Thrombocytopenia  Situational Anxiety    06/09/2013 post hosp extended ov/ transition of care/James Kidd re: copd/ severe chronic cough / quit smoking on admit on ACEi Chief Complaint  Patient presents with  . HFU    Pt states that his breathing has improved since d/c, but not back to his normal baseline.  He has prod cough with minimal clear to pale yellow sputum.  He using rescue inhaler 4 x per day, and has not needed to use neb.    hx of multiple episodes where coughed so hard he passes out frequently but that hasn't happened since discharge off acei  Drainage better on flonase "when I use it"   No obvious daytime variabilty or assoc  cp or chest tightness, subjective wheeze overt sinus or hb symptoms. No unusual exp hx or h/o childhood pna/ asthma or premature birth to his knowledge.   Once gets to sleep at 30 degrees Sleeping ok without nocturnal  or early am exacerbation  of respiratory  c/o's or need for noct saba. Also denies any obvious fluctuation of symptoms with weather or environmental changes or other aggravating or alleviating factors except as  outlined above   Current Medications, Allergies, Past Medical History, Past Surgical History, Family History, and Social History were reviewed in [facility] electronic medical record.  ROS  The following are not active complaints unless bolded sore throat, dysphagia, dental problems, itching, sneezing,  nasal congestion or excess/ purulent secretions, ear ache,   fever, chills, sweats, unintended wt loss, pleuritic or exertional cp, hemoptysis,  orthopnea pnd or leg swelling, presyncope, palpitations, heartburn, abdominal pain, anorexia, nausea, vomiting, diarrhea  or change in bowel or urinary habits, change in stools or urine, dysuria,hematuria,  rash, arthralgias, visual complaints, headache, numbness weakness or ataxia or problems with walking or coordination,  change in mood/affect or memory.            Objective:   Physical Exam  06/09/2013       277  Wt Readings from Last 3 Encounters:  06/07/12 257 lb (116.574 kg)  04/26/12 263 lb (119.296 kg)      Hoarse wm nad at rest/ evasive with questions/ prominent pseudowheeze   HEENT mild turbinate edema.  Oropharynx  Generalized nonspecific  mucositis changes excess pnd or cobblestoning.  No JVD or cervical adenopathy. Mild accessory muscle hypertrophy. Trachea midline, nl thryroid. Chest was hyperinflated by percussion with diminished breath sounds and moderate increased exp time without wheeze. Hoover sign positive at mid inspiration. Regular rate and rhythm without murmur gallop or rub or increase P2 or edema.  Abd: no hsm, nl excursion. Ext warm without cyanosis or clubbing.       pcxr 06/01/13  Mild cardiomegaly which is stable. Unchanged upper mediastinal  contours. Diffuse interstitial coarsening which is stable over  multiple previous examinations, likely related to COPD. No  consolidation, effusion, or pneumothorax.          Assessment & Plan:

## 2013-06-09 NOTE — Patient Instructions (Addendum)
Start dulera 100 Take 2 puffs first thing in am and then another 2 puffs about 12 hours later - work on smooth deep breath.  Only wear 02 at bedtime at 2lpm - none needed daytime   Stop spiriva and breo  Plan B = backup  Only use your albuterol (proventil)  as a rescue medication to be used if you can't catch your breath by resting or doing a relaxed purse lip breathing pattern.  - The less you use it, the better it will work when you need it. - Ok to use up to 2 puffs  every 4 hours if you must but call for immediate appointment if use goes up over your usual need - Don't leave home without it !!  (think of it like the spare tire for your car)   For cough >  As needed delsym 2 tsp every 12 hours  For drainage >  As needed add For drainage take chlortrimeton (chlorpheniramine) 4 mg every 4 hours available over the counter (may cause drowsiness)   Pantoprazole (protonix) 40 mg   Take 30-60 min before first meal of the day and Pepcid 20 mg one bedtime until return to office - this is the best way to tell whether stomach acid is contributing to your problem.    GERD (REFLUX)  is an extremely common cause of respiratory symptoms, many times with no significant heartburn at all.    It can be treated with medication, but also with lifestyle changes including avoidance of late meals, excessive alcohol, smoking cessation, and avoid fatty foods, chocolate, peppermint, colas, red wine, and acidic juices such as orange juice.  NO MINT OR MENTHOL PRODUCTS SO NO COUGH DROPS  USE SUGARLESS CANDY INSTEAD (jolley ranchers or Stover's)  NO OIL BASED VITAMINS - use powdered substitutes.    See Tammy NP w/in 2 weeks with all your medications, even over the counter meds, separated in two separate bags, the ones you take no matter what vs the ones you stop once you feel better and take only as needed when you feel you need them.   Tammy  will generate for you a new user friendly medication calendar that will  put us all on the same page re: your medication use.     Without this process, it simply isn't possible to assure that we are providing  your outpatient care  with  the attention to detail we feel you deserve.   If we cannot assure that you're getting that kind of care,  then we cannot manage your problem effectively from this clinic.  Once you have seen Tammy and we are sure that we're all on the same page with your medication use she will arrange follow up with me.

## 2013-06-10 ENCOUNTER — Telehealth: Payer: Self-pay | Admitting: Internal Medicine

## 2013-06-10 DIAGNOSIS — R05 Cough: Secondary | ICD-10-CM | POA: Insufficient documentation

## 2013-06-10 DIAGNOSIS — R058 Other specified cough: Secondary | ICD-10-CM | POA: Insufficient documentation

## 2013-06-10 MED ORDER — PANTOPRAZOLE SODIUM 40 MG PO TBEC: DELAYED_RELEASE_TABLET | ORAL | Status: DC

## 2013-06-10 NOTE — Telephone Encounter (Signed)
Rx was not sent at ov  I have sent to pharm  I called and spoke with the pt's spouse and notified her of this  Nothing further needed

## 2013-06-10 NOTE — Assessment & Plan Note (Signed)
The most common causes of chronic cough in immunocompetent adults include the following: upper airway cough syndrome (UACS), previously referred to as postnasal drip syndrome (PNDS), which is caused by variety of rhinosinus conditions; (2) asthma; (3) GERD; (4) chronic bronchitis from cigarette smoking or other inhaled environmental irritants; (5) nonasthmatic eosinophilic bronchitis; and (6) bronchiectasis.   These conditions, singly or in combination, have accounted for up to 94% of the causes of chronic cough in prospective studies.   Other conditions have constituted no >6% of the causes in prospective studies These have included bronchogenic carcinoma, chronic interstitial pneumonia, sarcoidosis, left ventricular failure, ACEI-induced cough, and aspiration from a condition associated with pharyngeal dysfunction.    Chronic cough is often simultaneously caused by more than one condition. A single cause has been found from 38 to 82% of the time, multiple causes from 18 to 62%. Multiply caused cough has been the result of three diseases up to 42% of the time.       Based on hx and exam, this is most likely:  Classic Upper airway cough syndrome, so named because it's frequently impossible to sort out how much is  CR/sinusitis with freq throat clearing (which can be related to primary GERD)   vs  causing  secondary (" extra esophageal")  GERD from wide swings in gastric pressure that occur with throat clearing, often  promoting self use of mint and menthol lozenges that reduce the lower esophageal sphincter tone and exacerbate the problem further in a cyclical fashion.   These are the same pts (now being labeled as having "irritable larynx syndrome" by some cough centers) who not infrequently have a history of having failed to tolerate ace inhibitors and   dry powder inhalers  (both likely scenarios here) or biphosphonates or report having atypical reflux symptoms that don't respond to standard doses of  PPI , and are easily confused as having aecopd or asthma flares by even experienced allergists/ pulmonologists.   The first step is to maximize acid suppression and eliminate acei and dpi's then regroup with all meds in hand   See instructions for specific recommendations which were reviewed directly with the patient who was given a copy with highlighter outlining the key components.

## 2013-06-10 NOTE — Assessment & Plan Note (Addendum)
-   PFT's 04/20/08  FEV1  2.54 (69%) ratio 58 and no change p B2  DLCO 55 corrects to 88 - PFT's 06/07/2012  FEV1  1.93 (49%)  But 65% p B2 (31% improvement) DLCO 77 - 05/2013 admit on ACEi > d/c'd off acei and on breo/spiriva  DDX of  difficult airways managment all start with A and  include Adherence, Ace Inhibitors, Acid Reflux, Active Sinus Disease, Alpha 1 Antitripsin deficiency, Anxiety masquerading as Airways dz,  ABPA,  allergy(esp in young), Aspiration (esp in elderly), Adverse effects of DPI,  Active smokers, plus two Bs  = Bronchiectasis and Beta blocker use..and one C= CHF  Adherence is always the initial "prime suspect" and is a multilayered concern that requires a "trust but verify" approach in every patient - starting with knowing how to use medications, especially inhalers, correctly, keeping up with refills and understanding the fundamental difference between maintenance and prns vs those medications only taken for a very short course and then stopped and not refilled.  The proper method of use, as well as anticipated side effects, of a metered-dose inhaler are discussed and demonstrated to the patient. Improved effectiveness after extensive coaching during this visit to a level of approximately  75% so trydulera 100 2bid as least likely to cause a cough  ACEi > definitely avoid in this chronic cougher to point of passing out  ? Acid (or non-acid) GERD > always difficult to exclude as up to 75% of pts in some series report no assoc GI/ Heartburn symptoms> rec max (24h)  acid suppression and diet restrictions/ reviewed and instructions given in writing.   Def need med rec (not aware on prev visits he was even on an ACEi)  ? Active smoking > denies since inpt admit> rec maintain off at all costs    To keep things simple, I have asked the patient to first separate medicines that are perceived as maintenance, that is to be taken daily "no matter what", from those medicines that are taken on  only on an as-needed basis and I have given the patient examples of both, and then return to see our NP to generate a  detailed  medication calendar which should be followed until the next physician sees the patient and updates it.

## 2013-06-10 NOTE — Assessment & Plan Note (Signed)
-   on 02 at hs as of 05/23/13 admit  - 06/09/2013   Walked RA x 2 laps @ 185 stopped due to   Sob no desat  rec continue 02 2lpm at hs only for now

## 2013-06-10 NOTE — Assessment & Plan Note (Signed)
Adequate control on present rx, reviewed > no change in rx needed   

## 2013-06-23 ENCOUNTER — Ambulatory Visit (INDEPENDENT_AMBULATORY_CARE_PROVIDER_SITE_OTHER): Payer: 59 | Admitting: Adult Health

## 2013-06-23 ENCOUNTER — Encounter: Payer: Self-pay | Admitting: Adult Health

## 2013-06-23 VITALS — BP 128/76 | HR 97 | Temp 98.2°F | Ht 70.0 in | Wt 275.4 lb

## 2013-06-23 DIAGNOSIS — J449 Chronic obstructive pulmonary disease, unspecified: Secondary | ICD-10-CM

## 2013-06-23 MED ORDER — HYDROCODONE-HOMATROPINE 5-1.5 MG/5ML PO SYRP: 5.0000 mL | ORAL_SOLUTION | Freq: Four times a day (QID) | ORAL | Status: DC | PRN

## 2013-06-23 NOTE — Assessment & Plan Note (Signed)
COPD with upper airway cough d/t ACE , GERD and AR triggers  Cont w/ smoking cessation  Cough control regimen w/ add hydromet  Refer to pulm rehab   Plan  Will try prior authorization for Symbicort -continue on [facility] for now.  Refer to pulmonary rehab.  May use Hydromet in addition to delsym to help with cough , may make you sleepy.  Follow up with Dr. Sherene Sires  In 6 weeks and As needed   Please contact office for sooner follow up if symptoms do not improve or worsen or seek emergency care

## 2013-06-23 NOTE — Addendum Note (Signed)
Addended by: Boone Master E on: 06/23/2013 01:00 PM   Modules accepted: Orders

## 2013-06-23 NOTE — Patient Instructions (Addendum)
Will try prior authorization for Symbicort -continue on Dulera (higer dose)  for now.  Refer to pulmonary rehab.  May use Hydromet in addition to delsym to help with cough , may make you sleepy.  Follow up with Dr. Sherene Sires  In 6 weeks and As needed   Please contact office for sooner follow up if symptoms do not improve or worsen or seek emergency care

## 2013-06-23 NOTE — Progress Notes (Signed)
Subjective:    Patient ID: James Kidd, male    DOB: 1955/12/07    MRN: [id number]   Brief patient profile:  12 yowm smoker dx copd by [facility] referred by Dr Cyndia Bent for re-evaluation with GOLD II criteria as of 05/2012     History of Present Illness  04/26/2012 1st pulmonary cc indolent onset x > 10 year sob progressed to point where can still walk distances like the mall but has to be slow and flat.  Ok at rest. Has to sleep at 30 degrees due to breathing and then episodes where much worse, esp with cold wet weather,  Assoc with daily dry cough so hard passes out occasionally but frequently feels presyncope with cough assoc with midline chest discomfort. Take daily spiriva and lots of saba but doesn't think it helps much. Prednisone helped a lot the last time (2 months prior to OV  He took it. rec Take delsym two tsp every 12 hours and supplement if needed with  tramadol 50 mg up to 2 every 4 hours to suppress the urge to cough. Swallowing water or using ice chips/non mint and menthol containing candies (such as lifesavers or sugarless jolly ranchers) are also effective.  You should rest your voice and avoid activities that you know make you cough. Once you have eliminated the cough for 3 straight days try reducing the tramadol first,  then the delsym as tolerated.   Stop spiriva and take symbicort 160 Take 2 puffs first thing in am and then another 2 puffs about 12 hours later.  Pantoprazole (protonix) 40 mg   Take 30-60 min before first meal of the day and Pepcid 20 mg one bedtime until return to office - this is the best way to tell whether stomach acid is contributing to your problem.   Only use your albuterol(proventil)  as a rescue medication   06/07/2012 f/u ov/Wert f/u copd GOLD II / still smoking  Chief Complaint  Patient presents with  . Followup with PFT    Pt states his breathing is about the same, no better or worse since the last visit. No new co's today.   still has sense of  chest congestion day = night but cough severity is better, min mucoid sputum, doe so still using saba more than twice daily  rec You only have mild copd which is likely to stabilize where it is if your stop smoking now, which will also help your cough Continue symbicort Take 2 puffs first thing in am and then another 2 puffs about 12 hours later.  Only use your albuterol (proventil)as a rescue medication to be used if you can't catch your breath by resting or doing a relaxed purse lip breathing pattern. The less you use it, the better it will work when you need it.   Admit date: 05/30/2013  Discharge date: 06/02/2013  Discharge Diagnoses:  COPD with Acute Exacerbation  OSA - on CPAP, nasal mask  Cough - with pre-syncope symptoms  Seasonal Allergies  Hypoxemia  Tachycardia  HLD  Hypertension  Left Ventricular Hypoplasia  Hypokalemia  Thrombocytopenia  Situational Anxiety    06/09/2013 post hosp extended ov/ transition of care/Wert re: copd/ severe chronic cough / quit smoking on admit on ACEi Chief Complaint  Patient presents with  . HFU    Pt states that his breathing has improved since d/c, but not back to his normal baseline.  He has prod cough with minimal clear to pale yellow sputum.  He using rescue inhaler 4 x per day, and has not needed to use neb.    hx of multiple episodes where coughed so hard he passes out frequently but that hasn't happened since discharge off acei  Drainage better on flonase "when I use it"  >>d/c Virgel BouquetBREO Sherrye Payor/Spiriva and rx Dulera   06/23/2013 Follow up and Med Review  Patient returns for a two-week followup and medication review. We reviewed all his medications and organized them into a medication calendar with patient education. Patient appears to be taking his medications correctly Would like us to do a prior authorization for Symbicort -felt he did better on this inhaler.  Says tried Tramadol in past , does not work for his cough  Patient was taken off of  ACE inhibitor. Due to persistent cough. Patient reports that his cough is only minimally improved. He does use Delsym on occasion, but does not seem to help with his cough control. Has used Tussionex in the past with some help. Patient denies any hemoptysis, chest pain, orthopnea, PND, or leg swelling.   Current Medications, Allergies, Past Medical History, Past Surgical History, Family History, and Social History were reviewed in [facility] electronic medical record.  ROS  The following are not active complaints unless bolded sore throat, dysphagia, dental problems, itching, sneezing,  nasal congestion or excess/ purulent secretions, ear ache,   fever, chills, sweats, unintended wt loss, pleuritic or exertional cp, hemoptysis,  orthopnea pnd or leg swelling, presyncope, palpitations, heartburn, abdominal pain, anorexia, nausea, vomiting, diarrhea  or change in bowel or urinary habits, change in stools or urine, dysuria,hematuria,  rash, arthralgias, visual complaints, headache, numbness weakness or ataxia or problems with walking or coordination,  change in mood/affect or memory.            Objective:   Physical Exam  06/09/2013       277 >>275 06/23/2013 >275 06/23/2013    Hoarse wm nad   HEENT mild turbinate edema.  Oropharynx  Generalized nonspecific  mucositis changes excess pnd or cobblestoning.  No JVD or cervical adenopathy. Mild accessory muscle hypertrophy. Trachea midline, nl thryroid. Chest was hyperinflated by percussion with diminished breath sounds and moderate increased exp time without wheeze. Hoover sign positive at mid inspiration. Regular rate and rhythm without murmur gallop or rub or increase P2 or edema.  Abd: no hsm, nl excursion. Ext warm without cyanosis or clubbing.       pcxr 06/01/13  Mild cardiomegaly which is stable. Unchanged upper mediastinal  contours. Diffuse interstitial coarsening which is stable over  multiple previous examinations, likely related to  COPD. No  consolidation, effusion, or pneumothorax.          Assessment & Plan:

## 2013-06-27 NOTE — Addendum Note (Signed)
Addended by: Nicanor Alcon on: 06/27/2013 05:14 PM   Modules accepted: Orders

## 2013-07-03 ENCOUNTER — Telehealth (HOSPITAL_COMMUNITY): Payer: Self-pay | Admitting: *Deleted

## 2013-07-17 ENCOUNTER — Telehealth (HOSPITAL_COMMUNITY): Payer: Self-pay | Admitting: *Deleted

## 2013-08-04 ENCOUNTER — Ambulatory Visit (INDEPENDENT_AMBULATORY_CARE_PROVIDER_SITE_OTHER): Payer: 59 | Admitting: Internal Medicine

## 2013-08-04 ENCOUNTER — Encounter: Payer: Self-pay | Admitting: Internal Medicine

## 2013-08-04 ENCOUNTER — Encounter (HOSPITAL_COMMUNITY)
Admission: RE | Admit: 2013-08-04 | Discharge: 2013-08-04 | Disposition: A | Discharge status: Home / Self Care | Payer: 59 | Source: Ambulatory Visit | Attending: Internal Medicine | Admitting: Internal Medicine

## 2013-08-04 ENCOUNTER — Other Ambulatory Visit: Payer: Self-pay | Admitting: Internal Medicine

## 2013-08-04 VITALS — BP 114/70 | HR 96 | Temp 98.6°F | Ht 71.0 in | Wt 261.0 lb

## 2013-08-04 VITALS — BP 110/60 | HR 90

## 2013-08-04 DIAGNOSIS — J9610 Chronic respiratory failure, unspecified whether with hypoxia or hypercapnia: Secondary | ICD-10-CM | POA: Insufficient documentation

## 2013-08-04 DIAGNOSIS — F17200 Nicotine dependence, unspecified, uncomplicated: Secondary | ICD-10-CM

## 2013-08-04 DIAGNOSIS — J441 Chronic obstructive pulmonary disease with (acute) exacerbation: Secondary | ICD-10-CM

## 2013-08-04 DIAGNOSIS — I10 Essential (primary) hypertension: Secondary | ICD-10-CM

## 2013-08-04 DIAGNOSIS — R05 Cough: Secondary | ICD-10-CM | POA: Insufficient documentation

## 2013-08-04 DIAGNOSIS — R0602 Shortness of breath: Secondary | ICD-10-CM | POA: Insufficient documentation

## 2013-08-04 DIAGNOSIS — J4489 Other specified chronic obstructive pulmonary disease: Secondary | ICD-10-CM | POA: Insufficient documentation

## 2013-08-04 DIAGNOSIS — J449 Chronic obstructive pulmonary disease, unspecified: Secondary | ICD-10-CM

## 2013-08-04 DIAGNOSIS — R059 Cough, unspecified: Secondary | ICD-10-CM | POA: Insufficient documentation

## 2013-08-04 MED ORDER — MOMETASONE FURO-FORMOTEROL FUM 200-5 MCG/ACT IN AERO: 2.0000 | INHALATION_SPRAY | Freq: Two times a day (BID) | RESPIRATORY_TRACT | Status: DC

## 2013-08-04 MED ORDER — ALBUTEROL SULFATE HFA 108 (90 BASE) MCG/ACT IN AERS: INHALATION_SPRAY | RESPIRATORY_TRACT | Status: AC

## 2013-08-04 MED ORDER — ALBUTEROL SULFATE HFA 108 (90 BASE) MCG/ACT IN AERS: INHALATION_SPRAY | RESPIRATORY_TRACT | Status: DC

## 2013-08-04 NOTE — Progress Notes (Signed)
James HooverJeffery L Holohan 58 y.o. male Pulmonary Rehab Orientation Note Patient arrived today in [facility] for orientation to Pulmonary Rehab. He was walked from the emergency department parking without difficulty. He does not carry portable oxygen. Per pt, he uses oxygen at night when going to sleep. Color good, skin warm and dry. Patient is oriented to time and place. Patient's medical history and medications reviewed. Heart rate is normal, breath sounds clear to auscultation, no wheezes, rales, or rhonchi, diminished. Grip strength equal, strong. Distal pulses 3+ bilateral posterior pulses present. Patient reports hedoes take medications as prescribed. Patient states he is following a a no to low carbohydrate and sugar diet. The patient has been trying to lose weight through a healthy diet and exercise program. Patient's weight will be monitored closely. Demonstration and practice of PLB using pulse oximeter. Patient able to return demonstration satisfactorily. Safety and hand hygiene in the exercise area reviewed with patient. Patient voices understanding of the information reviewed. Department expectations discussed with patient and achievable goals were set. The patient shows enthusiasm about attending the program and we look forward to working with this nice gentleman. The patient is scheduled for a 6 min walk test on Thursday, August 07, 2013 @ 3:30pm and to begin exercise on Tuesday, August 12, 2013 in the 1:30pm class.   Haskel KhanJoan Kenlea Woodell RN [phone]

## 2013-08-04 NOTE — Assessment & Plan Note (Signed)
D/c ACE 05/2013 due to cough > resolved   Adequate control on present rx, reviewed > no change in rx needed

## 2013-08-04 NOTE — Assessment & Plan Note (Signed)
-   PFT's 04/20/08  FEV1  2.54 (69%) ratio 58 and no change p B2  DLCO 55 corrects to 88 - PFT's 06/07/2012  FEV1  1.93 (49%)  But 65% p B2 (31% improvement) DLCO 77 - 05/2013 admit on ACEi > d/c'd off acei and on breo/spiriva - - 06/09/2013  try dulera 100 2bid  -Med calendar 06/23/2013 and try dulera 200 2bid > not following calendar at all 08/04/2013   DDX of  difficult airways management all start with A and  include Adherence, Ace Inhibitors, Acid Reflux, Active Sinus Disease, Alpha 1 Antitripsin deficiency, Anxiety masquerading as Airways dz,  ABPA,  allergy(esp in young), Aspiration (esp in elderly), Adverse effects of DPI,  Active smokers, plus two Bs  = Bronchiectasis and Beta blocker use..and one C= CHF  Adherence is always the initial "prime suspect" and is a multilayered concern that requires a "trust but verify" approach in every patient - starting with knowing how to use medications, especially inhalers, correctly, keeping up with refills and understanding the fundamental difference between maintenance and prns vs those medications only taken for a very short course and then stopped and not refilled.  - no using calendar -The proper method of use, as well as anticipated side effects, of a metered-dose inhaler are discussed and demonstrated to the patient. Improved effectiveness after extensive coaching during this visit to a level of approximately  90% so continue dulera 200 2bid as insurance not covering symbiocort  Active smoking > see sep a/p  ? Acid (or non-acid) GERD > always difficult to exclude as up to 75% of pts in some series report no assoc GI/ Heartburn symptoms> rec restart max (24h)  acid suppression and diet restrictions in the event of cough flare     See instructions for specific recommendations which were reviewed directly with the patient who was given a copy with highlighter outlining the key components.

## 2013-08-04 NOTE — Progress Notes (Signed)
James HooverJeffery [name] 58 y.o. male  Initial Psychosocial Assessment  Pt psychosocial assessment reveals pt lives with their spouse. 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 Pt hobbies include gardening, blade smithing, shooting, reloading ammunition, and fishing.. Pt reports his stress level is low. Areas of stress/anxiety include Health.  Pt does not exhibit signs of depression. Pt shows good  coping skills with positive outlook .  Offered emotional support and reassurance. Monitor and evaluate progress toward psychosocial goal(s).  Goal(s): Long term goal is to build up endurance to be able to walk long distances for exercise Short term goals are to improve A1C, lower blood pressure, and loose weight to improve his health. Help patient work toward returning to meaningful activities that improve patient's QOL and are attainable with patient's lung disease   08/04/2013 1:23 PM

## 2013-08-04 NOTE — Patient Instructions (Addendum)
For any increase cough > Pantoprazole (protonix) 40 mg   Take 30-60 min before first meal of the day and Pepcid 20 mg one bedtime    Plan A = Dulera 200 Take 2 puffs first thing in am and then another 2 puffs about 12 hours later.    Plan B= Proair (albuterol) Only use your albuterol as a rescue medication to be used if you can't catch your breath by resting or doing a relaxed purse lip breathing pattern.  - The less you use it, the better it will work when you need it. - Ok to use up to 2 puffs  every 4 hours if you must but call for immediate appointment if use goes up over your usual need - Don't leave home without it !!  (think of it like the spare tire for your car)   Plan C = Albuterol neb, up to every 4 hours but call us right away if need goes up  See calendar for specific medication instructions and bring it back for each and every office visit for every healthcare provider you see.  Without it,  you may not receive the best quality medical care that we feel you deserve.  You will note that the calendar groups together  your maintenance  medications that are timed at particular times of the day.  Think of this as your checklist for what your doctor has instructed you to do until your next evaluation to see what benefit  there is  to staying on a consistent group of medications intended to keep you well.  The other group at the bottom is entirely up to you to use as you see fit  for specific symptoms that may arise between visits that require you to treat them on an as needed basis.  Think of this as your action plan or "what if" list.   Separating the top medications from the bottom group is fundamental to providing you adequate care going forward.     If you are satisfied with your treatment plan,  let your doctor know and he/she can either refill your medications or you can return here when your prescription runs out.     If in any way you are not 100% satisfied,  please tell us.  If  100% better, tell your friends!  Pulmonary follow up is as needed

## 2013-08-04 NOTE — Assessment & Plan Note (Signed)
I took an extended  opportunity with this patient to outline the consequences of continued cigarette use  in airway disorders based on all the data we have from the multiple national lung health studies (perfomed over decades at millions of dollars in cost)  indicating that smoking cessation, not choice of inhalers or physicians, is the most important aspect of care.   

## 2013-08-04 NOTE — Progress Notes (Signed)
Subjective:    Patient ID: James Kidd, male    DOB: 1955/12/07    MRN: [id number]   Brief patient profile:  12 yowm smoker dx copd by [facility] referred by James Kidd for re-evaluation with GOLD II criteria as of 05/2012     History of Present Illness  04/26/2012 1st pulmonary cc indolent onset x > 10 year sob progressed to point where can still walk distances like the mall but has to be slow and flat.  Ok at rest. Has to sleep at 30 degrees due to breathing and then episodes where much worse, esp with cold wet weather,  Assoc with daily dry cough so hard passes out occasionally but frequently feels presyncope with cough assoc with midline chest discomfort. Take daily spiriva and lots of saba but doesn't think it helps much. Prednisone helped a lot the last time (2 months prior to OV  He took it. rec Take delsym two tsp every 12 hours and supplement if needed with  tramadol 50 mg up to 2 every 4 hours to suppress the urge to cough. Swallowing water or using ice chips/non mint and menthol containing candies (such as lifesavers or sugarless jolly ranchers) are also effective.  You should rest your voice and avoid activities that you know make you cough. Once you have eliminated the cough for 3 straight days try reducing the tramadol first,  then the delsym as tolerated.   Stop spiriva and take symbicort 160 Take 2 puffs first thing in am and then another 2 puffs about 12 hours later.  Pantoprazole (protonix) 40 mg   Take 30-60 min before first meal of the day and Pepcid 20 mg one bedtime until return to office - this is the best way to tell whether stomach acid is contributing to your problem.   Only use your albuterol(proventil)  as a rescue medication   06/07/2012 f/u ov/James Kidd f/u copd GOLD II / still smoking  Chief Complaint  Patient presents with  . Followup with PFT    Pt states his breathing is about the same, no better or worse since the last visit. No new co's today.   still has sense of  chest congestion day = night but cough severity is better, min mucoid sputum, doe so still using saba more than twice daily  rec You only have mild copd which is likely to stabilize where it is if your stop smoking now, which will also help your cough Continue symbicort Take 2 puffs first thing in am and then another 2 puffs about 12 hours later.  Only use your albuterol (proventil)as a rescue medication to be used if you can't catch your breath by resting or doing a relaxed purse lip breathing pattern. The less you use it, the better it will work when you need it.   Admit date: 05/30/2013  Discharge date: 06/02/2013  Discharge Diagnoses:  COPD with Acute Exacerbation  OSA - on CPAP, nasal mask  Cough - with pre-syncope symptoms  Seasonal Allergies  Hypoxemia  Tachycardia  HLD  Hypertension  Left Ventricular Hypoplasia  Hypokalemia  Thrombocytopenia  Situational Anxiety    06/09/2013 post hosp extended ov/ transition of care/James Kidd re: copd/ severe chronic cough / quit smoking on admit on ACEi Chief Complaint  Patient presents with  . HFU    Pt states that his breathing has improved since d/c, but not back to his normal baseline.  He has prod cough with minimal clear to pale yellow sputum.  He using rescue inhaler 4 x per day, and has not needed to use neb.    hx of multiple episodes where coughed so hard he passes out frequently but that hasn't happened since discharge off acei  Drainage better on flonase "when I use it"  >>d/c James BouquetBREO James Kidd/Spiriva and rx Dulera   06/23/2013 Follow up and Med Review  Patient returns for a two-week followup and medication review. We reviewed all his medications and organized them into a medication calendar with patient education. Patient appears to be taking his medications correctly Would like us to do a prior authorization for Symbicort -felt he did better on this inhaler.  Says tried Tramadol in past , does not work for his cough  Patient was taken off of  ACE inhibitor. Due to persistent cough. Patient reports that his cough is only minimally improved. He does use Delsym on occasion, but does not seem to help with his cough control. Has used Tussionex in the past with some help.  rec Will try prior authorization for Symbicort -continue on Dulera (higer dose)  for now.  Refer to pulmonary rehab.  May use Hydromet in addition to delsym to help with cough , may make you sleepy.   08/04/2013 f/u ov/James Kidd re: smoking again, not using med calendar, add libbing maint vs prns Chief Complaint  Patient presents with  . Follow-up    Pt reports his breathing is doing well and his cough has resovled. He is using rescue inhaler at least once per day but has not had to use neb.  typically using saba in afternoons if over does it.   No obvious patterns in day to day or daytime variabilty or assoc chronic cough or cp or chest tightness, subjective wheeze overt sinus or hb symptoms. No unusual exp hx or h/o childhood pna/ asthma or knowledge of premature birth.  Sleeping ok without nocturnal  or early am exacerbation  of respiratory  c/o's or need for noct saba. Also denies any obvious fluctuation of symptoms with weather or environmental changes or other aggravating or alleviating factors except as outlined above   Current Medications, Allergies, Complete Past Medical History, Past Surgical History, Family History, and Social History were reviewed [facility] electronic [facility].  ROS  The following are not active complaints unless bolded sore throat, dysphagia, dental problems, itching, sneezing,  nasal congestion or excess/ purulent secretions, ear ache,   fever, chills, sweats, unintended wt loss, pleuritic or exertional cp, hemoptysis,  orthopnea pnd or leg swelling, presyncope, palpitations, heartburn, abdominal pain, anorexia, nausea, vomiting, diarrhea  or change in bowel or urinary habits, change in stools or urine, dysuria,hematuria,  rash,  arthralgias, visual complaints, headache, numbness weakness or ataxia or problems with walking or coordination,  change in mood/affect or memory.                 Objective:   Physical Exam  06/09/2013       277 >>275 06/23/2013 >275 06/23/2013 > 08/04/2013 261   Hoarse wm nad   HEENT mild turbinate edema.  Oropharynx  Generalized nonspecific  mucositis changes excess pnd or cobblestoning.  No JVD or cervical adenopathy. Mild accessory muscle hypertrophy. Trachea midline, nl thryroid. Chest was hyperinflated by percussion with diminished breath sounds and moderate increased exp time without wheeze. Hoover sign positive at mid inspiration. Regular rate and rhythm without murmur gallop or rub or increase P2 or edema.  Abd: no hsm, nl excursion. Ext warm without cyanosis or  clubbing.       pcxr 06/01/13  Mild cardiomegaly which is stable. Unchanged upper mediastinal  contours. Diffuse interstitial coarsening which is stable over  multiple previous examinations, likely related to COPD. No  consolidation, effusion, or pneumothorax.          Assessment & Plan:

## 2013-08-07 ENCOUNTER — Encounter (HOSPITAL_COMMUNITY)
Admission: RE | Admit: 2013-08-07 | Discharge: 2013-08-07 | Disposition: A | Discharge status: Home / Self Care | Payer: 59 | Source: Ambulatory Visit | Attending: Internal Medicine | Admitting: Internal Medicine

## 2013-08-07 NOTE — Progress Notes (Signed)
James Kidd completed a Six-Minute Walk Test on 08/07/2013 . James Kidd walked 783 feet with 0 breaks.  The patient's lowest oxygen saturation was 93 , highest heart rate was 128 , and highest blood pressure was 122/70. The patient was on room air.  James Kidd stated that nothing hindered their walk test.

## 2013-08-11 ENCOUNTER — Encounter (HOSPITAL_COMMUNITY): Payer: Self-pay

## 2013-08-11 NOTE — Outcomes Assessment (Signed)
[facility] Baseline Outcomes Assessment   Anthropometrics:    Height (inches): 71   Weight (kg): 118.5   Grip strength was measured using a Dynamometer.  The patient's highest score was a 58.  Functional Status/Exercise Capacity:   James Kidd had a resting heart rate of 90 BPM, a resting blood pressure of 110/60, and an oxygen saturation of 96 % on room air.  James Kidd performed a 6-minute walk test on 08/07/13.  The patient completed 783 feet in 6 minutes with 0 rest breaks.  This quantifies 2.07 METS.   Dyspnea Measures:   The [facility] is a simple and standardized method of classifying disability in patients with COPD.  The assessment correlates disability and dyspnea.  At entrance the patient scored a 3. The scale is provided below.   0= I only get breathless with strenuous exercise. 1= I get short of breath when hurrying on level ground or walking up a slight incline. 2= On level ground, I walk slower than people of the same age because of breathlessness, or have to stop for breath when walking at my own pace. 3= I stop for breath after walking 100 yards or after a few minutes on level ground. 4=I am too breathless to leave the house or I am breathless when dressing.     The patient completed the [facility] Shortness Of Breath Questionnaire ([facility]).  This questionnaire relates activities of daily living and shortness of breath.  The score ranges from 0-120, a higher score relates to severe shortness of breath during activities of daily living. The patient's score at entrance was 52.  Quality of Life:   James Kidd and James Kidd Quality of Life Index Pulmonary Version is used to assess the patients satisfaction in different domains of their life; health and functioning, socioeconomic, psychological/spiritual, and family. The overall score is recorded out of 30 points.  The patient's goal is to achieve an overall score of 21 or  higher.  James Kidd received a 23.12 at entrance.    The Patient Health Questionnaire (PHQ-2) is a first step approach for the screening of depression.  If the patient scores positive on the PHQ-2 the patient should be further assessed with the PHQ-9.  The Patient Health Questionnaire (PHQ-9) assesses the degree of depression.  Depression is important to monitor and track in pulmonary patients due to its prevalence in the population.  If the patient advances to the PHQ-9 the goal is to score less than 4 on this assessment.  James Kidd scored a 0 at entrance.  Clinical Assessment Tools:   The COPD Assessment Test (CAT) is a measurement tool to quantify how much of an impact the disease has on the patient's life.  This assessment aids the Pulmonary Rehab Team in designing the patients individualized treatment plan.  A CAT score ranges from 0-40.  A score of 10 or below indicates that COPD has a low impact on the patient's life whereas a score of 30 or higher indicates a severe impact. The patient's goal is a decrease of 1 point from entrance to discharge.  James Kidd had a CAT score of 22 at entrance.  Nutrition:   The "Rate My Plate" is a dietary assessment that quantifies the balance of a patient's diet.  This tool allows the Pulmonary Rehab Team to key in on the areas of the patient's diet that needs improving.  The team can then focus their nutritional education on those areas.  If  the patient scores 24-40, this means there are many ways they can make their eating habits healthier, 41-57 states that there are some ways they can make their eating habits healthier and a score of 58-72 states that they are making many healthy choices.  The patient's goal is to achieve a score of 49 or higher on this assessment.  James Kidd scored a 41 at entrance.  Oxygen Compliance:  Patient is currently on 0 liters at rest, 2 liters at night, and 0 liters for exercise.  James Kidd is currently using a cpap at night.  The patient is  currently compliant. The patient states that they do not have barriers that keep them from using their oxygen.    Education:   James Kidd will attend education classes during the course of Pulmonary Rehab.  Education classes that will be offered to the patient are Activities of Daily Living and Energy Conservation, Pursed Lip Breathing and Diaphragmatic Breathing, Nutrition, Exercise for the Pulmonary Patient, Warning Signs of Infection, Chronic Lung Disease, Advanced Directives, Medications, and Stress and Meditation.  The patient completed an assessment at the entrance of the program and will complete it again upon discharge to demonstrate the level of understanding provided by the educational classes.  This assessment includes 14 questions regarding all of the education topics above.  James Kidd achieved a score of 14/14 at entrance.  Smoking Cessation:   The patient is currently smoking 3 cigarettes a day.  Exercise:   James Kidd will be provided with an individualized Home Exercise Prescription (HEP) at the entrance of the program.  The patient will be followed by the Pulmonary Exercise Physiologist throughout the program to assist with the progression of the frequency, intensity, time, and type of exercise. The patient's long-term goal is to be exercising 30-60 minutes, 3-5 days per week. At entrance, the patient was exercising 0 days at home.

## 2013-08-12 ENCOUNTER — Encounter (HOSPITAL_COMMUNITY)
Admission: RE | Admit: 2013-08-12 | Discharge: 2013-08-12 | Disposition: A | Discharge status: Home / Self Care | Payer: 59 | Source: Ambulatory Visit | Attending: Internal Medicine | Admitting: Internal Medicine

## 2013-08-12 DIAGNOSIS — J449 Chronic obstructive pulmonary disease, unspecified: Secondary | ICD-10-CM | POA: Diagnosis not present

## 2013-08-12 DIAGNOSIS — R059 Cough, unspecified: Secondary | ICD-10-CM | POA: Diagnosis not present

## 2013-08-12 DIAGNOSIS — J9610 Chronic respiratory failure, unspecified whether with hypoxia or hypercapnia: Secondary | ICD-10-CM | POA: Diagnosis not present

## 2013-08-12 DIAGNOSIS — F17200 Nicotine dependence, unspecified, uncomplicated: Secondary | ICD-10-CM | POA: Diagnosis not present

## 2013-08-12 DIAGNOSIS — R05 Cough: Secondary | ICD-10-CM | POA: Diagnosis not present

## 2013-08-12 DIAGNOSIS — R0602 Shortness of breath: Secondary | ICD-10-CM | POA: Diagnosis present

## 2013-08-12 NOTE — Progress Notes (Signed)
Today, James Kidd exercised at [facility]. Service time was from 1030 to 1515.  The patient exercised for more than 31 minutes performing aerobic, strengthening, and stretching exercises. Oxygen saturation, heart rate, blood pressure, rate of perceived exertion, and shortness of breath were all monitored before, during, and after exercise. James Kidd presented with no problems at today's exercise session.   There was no workload change during today's exercise session.  Pre-exercise vitals:   Weight kg: 118.8   Liters of O2: ra   SpO2: 96   HR: 91   BP: 104/68   CBG: na  Exercise vitals:   Highest heartrate:  106   Lowest oxygen saturation: 96   Highest blood pressure: 118/74   Liters of 02: ra  Post-exercise vitals:   SpO2: 97   HR: 92   BP: 98/60   Liters of O2: ra   CBG: na  [name], Medical Director Dr. Rito EhrlichKrishnan is immediately available during today's Pulmonary Rehab session for James Kidd on 08/12/2013 at 1330 class time.

## 2013-08-14 ENCOUNTER — Encounter (HOSPITAL_COMMUNITY)
Admission: RE | Admit: 2013-08-14 | Discharge: 2013-08-14 | Disposition: A | Discharge status: Home / Self Care | Payer: 59 | Source: Ambulatory Visit | Attending: Internal Medicine | Admitting: Internal Medicine

## 2013-08-14 DIAGNOSIS — R0602 Shortness of breath: Secondary | ICD-10-CM | POA: Diagnosis not present

## 2013-08-14 NOTE — Progress Notes (Signed)
Today, James Kidd exercised at [facility]. Service time was from 1:30 to 3:30.  The patient exercised for more than 31 minutes performing aerobic, strengthening, and stretching exercises. Oxygen saturation, heart rate, blood pressure, rate of perceived exertion, and shortness of breath were all monitored before, during, and after exercise. James Kidd presented with no problems at today's exercise session. The patient attended Exercise for the Pulmonary Patient today.  There was an increase in workload change during today's exercise session.  Pre-exercise vitals:   Weight kg: 116.4   Liters of O2: ra   SpO2: 96   HR: 97   BP: 126/64   CBG: na  Exercise vitals:   Highest heartrate:  111   Lowest oxygen saturation: 95   Highest blood pressure: 122/64   Liters of 02: ra  Post-exercise vitals:   SpO2: 97   HR: 96   BP: 126/64   Liters of O2: ra   CBG: na  [name], Medical Director Dr. Arbutus Leasat is immediately available during today's Pulmonary Rehab session for James Kidd on 08/14/13 at 1:30pm class time.

## 2013-08-19 ENCOUNTER — Encounter (HOSPITAL_COMMUNITY)
Admission: RE | Admit: 2013-08-19 | Discharge: 2013-08-19 | Disposition: A | Discharge status: Home / Self Care | Payer: 59 | Source: Ambulatory Visit | Attending: Internal Medicine | Admitting: Internal Medicine

## 2013-08-19 DIAGNOSIS — R0602 Shortness of breath: Secondary | ICD-10-CM | POA: Diagnosis not present

## 2013-08-19 NOTE — Progress Notes (Signed)
Today, [name] exercised at [facility]. Service time was from 1330 to 1515.  The patient exercised for more than 31 minutes performing aerobic, strengthening, and stretching exercises. Oxygen saturation, heart rate, blood pressure, rate of perceived exertion, and shortness of breath were all monitored before, during, and after exercise. James Kidd presented with no problems at today's exercise session.   There was a workload change during today's exercise session.  Pre-exercise vitals:   Weight kg: 116.6   Liters of O2: RA   SpO2: 97   HR: 88   BP: 122/70   CBG: NA  Exercise vitals:   Highest heartrate:  126   Lowest oxygen saturation: 96   Highest blood pressure: 130/80   Liters of 02: RA  Post-exercise vitals:   SpO2: 98   HR: 99   BP: 128/76   Liters of O2: RA   CBG: NA [name], Medical Director Dr. Arbutus Leasat is immediately available during today's Pulmonary Rehab session for James Kidd on 08/19/2013 at 1330 class time.

## 2013-08-21 ENCOUNTER — Encounter (HOSPITAL_COMMUNITY): Payer: 59

## 2013-08-26 ENCOUNTER — Encounter (HOSPITAL_COMMUNITY)
Admission: RE | Admit: 2013-08-26 | Discharge: 2013-08-26 | Disposition: A | Discharge status: Home / Self Care | Payer: 59 | Source: Ambulatory Visit | Attending: Internal Medicine | Admitting: Internal Medicine

## 2013-08-26 DIAGNOSIS — R059 Cough, unspecified: Secondary | ICD-10-CM | POA: Diagnosis not present

## 2013-08-26 DIAGNOSIS — R05 Cough: Secondary | ICD-10-CM | POA: Insufficient documentation

## 2013-08-26 DIAGNOSIS — J449 Chronic obstructive pulmonary disease, unspecified: Secondary | ICD-10-CM | POA: Insufficient documentation

## 2013-08-26 DIAGNOSIS — J4489 Other specified chronic obstructive pulmonary disease: Secondary | ICD-10-CM | POA: Insufficient documentation

## 2013-08-26 DIAGNOSIS — F17200 Nicotine dependence, unspecified, uncomplicated: Secondary | ICD-10-CM | POA: Insufficient documentation

## 2013-08-26 DIAGNOSIS — R0602 Shortness of breath: Secondary | ICD-10-CM | POA: Diagnosis not present

## 2013-08-26 DIAGNOSIS — J9610 Chronic respiratory failure, unspecified whether with hypoxia or hypercapnia: Secondary | ICD-10-CM | POA: Diagnosis not present

## 2013-08-26 NOTE — Progress Notes (Signed)
Today, [name] exercised at [facility]. Service time was from 1330 to 1515.  The patient exercised for more than 31 minutes performing aerobic, strengthening, and stretching exercises. Oxygen saturation, heart rate, blood pressure, rate of perceived exertion, and shortness of breath were all monitored before, during, and after exercise. James Kidd presented with no problems at today's exercise session.   There was no workload change during today's exercise session.  Pre-exercise vitals:   Weight kg: 115.2   Liters of O2: RA   SpO2: 96   HR: 97   BP: 104/60   CBG: NA  Exercise vitals:   Highest heartrate:  141   Lowest oxygen saturation: 94   Highest blood pressure: 128/67   Liters of 02: RA  Post-exercise vitals:   SpO2: 97   HR: 104   BP: 106/60   Liters of O2: RA   CBG: NA [name], Medical Director Dr. Vanessa BarbaraZamora is immediately available during today's Pulmonary Rehab session for James Kidd on 08/26/2013 at 1330 class time.

## 2013-08-28 ENCOUNTER — Encounter (HOSPITAL_COMMUNITY)
Admission: RE | Admit: 2013-08-28 | Discharge: 2013-08-28 | Disposition: A | Discharge status: Home / Self Care | Payer: 59 | Source: Ambulatory Visit | Attending: Internal Medicine | Admitting: Internal Medicine

## 2013-08-28 DIAGNOSIS — R0602 Shortness of breath: Secondary | ICD-10-CM | POA: Diagnosis not present

## 2013-08-28 NOTE — Progress Notes (Signed)
I have reviewed a Home Exercise Prescription with James Kidd . James Kidd is currently exercising at home.  The patient was advised to walk 3-5 days a week for 45 minutes.  James Kidd and I discussed how to progress their exercise prescription.  The patient stated that their goals were to be able to walk 5 miles without breaks at a steady pace.  The patient stated that they understand the exercise prescription.  We reviewed exercise guidelines, target heart rate during exercise, oxygen use, weather, home pulse oximeter, endpoints for exercise, and goals.  Patient is encouraged to come to me with any questions. I will continue to follow up with the patient to assist them with progression and safety.

## 2013-08-28 NOTE — Progress Notes (Signed)
Today, Sante exercised at [facility]. Service time was from 1330 to 1515.  The patient exercised for more than 31 minutes performing aerobic, strengthening, and stretching exercises. Oxygen saturation, heart rate, blood pressure, rate of perceived exertion, and shortness of breath were all monitored before, during, and after exercise. James Kidd presented with no problems at today's exercise session. James Kidd also attended an education session on Advanced Directives.  There was no workload change during today's exercise session.  Pre-exercise vitals:   Weight kg: 115.5   Liters of O2: ra   SpO2: 96   HR: 85   BP: 108/62   CBG: na  Exercise vitals:   Highest heartrate:  121   Lowest oxygen saturation: 95   Highest blood pressure: 126/62   Liters of 02: ra  Post-exercise vitals:   SpO2: 97   HR: 93   BP: 124/72   Liters of O2: ra   CBG: na  [name], Medical Director [name] is immediately available during today's Pulmonary Rehab session for James Kidd on 08/28/2013 at 1330 class time.

## 2013-08-28 NOTE — Progress Notes (Signed)
James Kidd 58 y.o. male Nutrition Note Spoke with pt. Pt is obese. There are some ways pt can make his eating habits healthier. Pt states his wife is disabled and is the primary cook. Pt's does not want to request "special foods because I know she won't be able to cook something for me and then something for her." Pt's Rate Your Plate results reviewed with pt. Pt wants to lose wt. Pt has been trying to lose wt by "cutting the carbs really low." Pt states he consumes 50 grams of carbs daily "but I set my goal low because I cheat and have pizza or a hot dog with a bun." Pt's choosing a very low carb diet and the ineffectiveness of fad diets long-term discussed. Pt expressed understanding.  Pt does not avoid salty food; uses canned/ convenience food.  Pt does not add salt to food.   Nutrition Diagnosis   Food-and nutrition-related knowledge deficit related to lack of exposure to information as related to diagnosis of pulmonary disease   Overweight/obesity related to excessive energy intake as evidenced by a BMI of 36.4   Limited adherence to nutrition-related recommendations related to poor understanding or disinterest as evidenced by food history. Nutrition Intervention   Pt's individual nutrition plan and goals reviewed with pt.   Benefits of adopting healthy eating habits discussed when pt's Rate Your Plate reviewed.   Pt to attend the Nutrition and Lung Disease class   Continual client-centered nutrition education by RD, as part of interdisciplinary care. Goal(s) 1. Pt to identify and limit food sources of sodium. 2. Identify food quantities necessary to achieve wt loss of  -2# per week  3. Describe the benefit of including fruits, vegetables, whole grains, and low-fat dairy products in a healthy meal plan. Monitor and Evaluate progress toward nutrition goal with team.   Mickle Plumb, M.Ed, RD, LDN, CDE 08/28/2013 3:09 PM

## 2013-09-02 ENCOUNTER — Encounter (HOSPITAL_COMMUNITY)
Admission: RE | Admit: 2013-09-02 | Discharge: 2013-09-02 | Disposition: A | Discharge status: Home / Self Care | Payer: 59 | Source: Ambulatory Visit | Attending: Internal Medicine | Admitting: Internal Medicine

## 2013-09-02 DIAGNOSIS — R0602 Shortness of breath: Secondary | ICD-10-CM | POA: Diagnosis not present

## 2013-09-02 NOTE — Progress Notes (Signed)
Nutrition Note Spoke with pt. Pt c/o decreased energy since he "reintroduced carbs." Pt states he is now consuming 100-120 grams of carbs per day, which is an improvement over the 50 grams of carbs pt was previously eating. Pt also c/o 0.5 kg wt gain with eating more carbs. Muscle stores replenishing glycogen are likely the source of wt gain with increased carb intake. Suspect decreased feeling of energy multifactorial (e.g. Pulmonary disease, pt continues to smoke and possibly the placebo effect of a fad diet). Continue to provide guidance and support re: dietary behavior changes as needed. Continue client-centered nutrition education by RD as part of interdisciplinary care.  Monitor and evaluate progress toward nutrition goal with team.  Mickle PlumbEdna Dari Carpenito, M.Ed, RD, LDN, CDE 09/02/2013 3:02 PM

## 2013-09-02 NOTE — Progress Notes (Signed)
Today, Usher exercised at [facility]. Service time was from 1330 to 1515.  The patient exercised for more than 31 minutes performing aerobic, strengthening, and stretching exercises. Oxygen saturation, heart rate, blood pressure, rate of perceived exertion, and shortness of breath were all monitored before, during, and after exercise. Leotis ShamesJeffery presented with no problems at today's exercise session.   There was no workload change during today's exercise session.  Pre-exercise vitals:   Weight kg: 115.9   Liters of O2: ra   SpO2: 94   HR: 98   BP: 118/76   CBG: na  Exercise vitals:   Highest heartrate:  105   Lowest oxygen saturation: 95   Highest blood pressure: 142/76   Liters of 02: ra  Post-exercise vitals:   SpO2: 97   HR: 82   BP: 104/72   Liters of O2: ra   CBG: na  [name], Medical Director [name] is immediately available during today's Pulmonary Rehab session for Henrietta HooverJeffery L Ticer on 09/02/2013 at 1330 class time.

## 2013-09-04 ENCOUNTER — Encounter (HOSPITAL_COMMUNITY)
Admission: RE | Admit: 2013-09-04 | Discharge: 2013-09-04 | Disposition: A | Discharge status: Home / Self Care | Payer: 59 | Source: Ambulatory Visit | Attending: Internal Medicine | Admitting: Internal Medicine

## 2013-09-04 DIAGNOSIS — R0602 Shortness of breath: Secondary | ICD-10-CM | POA: Diagnosis not present

## 2013-09-04 NOTE — Psychosocial Assessment (Signed)
James Kidd 58 y.o. male  30 day Psychosocial Note  Patient psychosocial assessment reveals no barriers to participation in Pulmonary Rehab. Patient does feel he is making progress toward Pulmonary Rehab goals.  His attendance has been great, he has missed only one exercise session due to an upset stomach.  He works very hard while he is exercising and is very interested in losing weight to prevent diabetes and lower his blood pressure.  He has actually lost 6 pounds since starting the program in the last 30 days.   Patient reports his health and activity level has improved in the past 30 days as evidenced by patient's report of increased ability to walk a fast pace on the track with less shortness of breath.  Patient states family/friends have not noticed changes in his activity or mood. Patient reports feeling positive about current and projected progression in Pulmonary Rehab. After reviewing the patient's treatment plan, the patient is making progress toward Pulmonary Rehab goals. Patient's rate of progress toward rehab goals is excellent. Plan of action to help patient continue to work towards rehab goals include increasing workloads as appropriate to increase strength and endurance. Will continue to monitor and evaluate progress toward psychosocial goal(s).  Goal(s) in progress: Continue encouraging weight loss Have dietary consult to make sure patient is eating enough "good" carbohydrates Help patient work toward returning to meaningful activities that improve patient's QOL and are attainable with patient's lung disease

## 2013-09-04 NOTE — Progress Notes (Signed)
Nutrition Note Spoke with pt. Pt continues to c/o decreased energy since he "reintroduced carbs." Pt states he is not sleeping well and he can tell he is holding fluid. Pt wt is down 2.5 kg (5.5 lb) over the past 3 weeks. Rate of wt loss is within expected limits. Pt's decreased energy level discussed with pt and Haskel KhanJoan Behrens, RN. Continue to suspect decreased feeling of energy multifactorial (e.g. Pulmonary disease, pt worked out at [employer] when in rehab and may have worked out too hard, pt continues to smoke and possibly the placebo effect of a fad diet). Pt instructed by RN to call his MD if wt gain continues. Continue to provide guidance and support re: dietary behavior changes as needed. Continue client-centered nutrition education by RD as part of interdisciplinary care.  Monitor and evaluate progress toward nutrition goal with team.  Mickle PlumbEdna Amarra Sawyer, M.Ed, RD, LDN, CDE 09/04/2013 3:33 PM

## 2013-09-04 NOTE — Progress Notes (Signed)
Today, Trace exercised at [facility]. Service time was from 1330 to 1530.  The patient exercised for more than 31 minutes performing aerobic, strengthening, and stretching exercises. Oxygen saturation, heart rate, blood pressure, rate of perceived exertion, and shortness of breath were all monitored before, during, and after exercise. James Kidd presented with no problems at today's exercise session. Patient attended the pulmonary medication class.  There was no workload change during today's exercise session.  Pre-exercise vitals:   Weight kg: 16.4   Liters of O2: ra   SpO2: 95   HR: 77   BP: 106/72   CBG: NA  Exercise vitals:   Highest heartrate:  93   Lowest oxygen saturation: 98   Highest blood pressure: 132/74   Liters of 02: RA  Post-exercise vitals:   SpO2: 96   HR: 74   BP: 124/72   Liters of O2: RA   CBG: NA [name], Medical Director Dr. Rito EhrlichKrishnan is immediately available during today's Pulmonary Rehab session for James HooverJeffery [name] on 09/04/2013 at 1330 class time.

## 2013-09-05 NOTE — Psychosocial Assessment (Signed)
Staff MD, Rehab Director Note/Attestation  Reviewed pshyosocial assessment. Agree with goal  Laid out by the staff of rehab  Dr. Kalman ShanMurali Nasire Reali, M.D., [facility].P Pulmonary and Critical Care Medicine Staff Physician and Director of Pulmonary Rehabilitation Services [place] [facility] [place] [facility] Pager: [phone], If no answer or between  15:00h - 7:00h: call [phone]  09/05/2013 2:59 PM

## 2013-09-09 ENCOUNTER — Encounter (HOSPITAL_COMMUNITY)
Admission: RE | Admit: 2013-09-09 | Discharge: 2013-09-09 | Disposition: A | Discharge status: Home / Self Care | Payer: 59 | Source: Ambulatory Visit | Attending: Internal Medicine | Admitting: Internal Medicine

## 2013-09-09 ENCOUNTER — Encounter (HOSPITAL_COMMUNITY): Payer: 59

## 2013-09-09 DIAGNOSIS — R0602 Shortness of breath: Secondary | ICD-10-CM | POA: Diagnosis not present

## 2013-09-09 NOTE — Progress Notes (Signed)
Today, [name] exercised at [facility]. Service time was from 1330 to 1500.  The patient exercised for more than 31 minutes performing aerobic, strengthening, and stretching exercises. Oxygen saturation, heart rate, blood pressure, rate of perceived exertion, and shortness of breath were all monitored before, during, and after exercise. James ShamesJeffery presented with no problems at today's exercise session.   There was no workload change during today's exercise session.  Pre-exercise vitals:   Weight kg: 115.9   Liters of O2: ra   SpO2: 97   HR: 85   BP: 98/68   CBG: na  Exercise vitals:   Highest heartrate:  117   Lowest oxygen saturation: 88 up to 95 with rest break and PLB   Highest blood pressure: 124/80   Liters of 02: ra  Post-exercise vitals:   SpO2: 97   HR: 93   BP: 102/58   Liters of O2: ra   CBG: na  [name], Medical Director Dr. Rito EhrlichKrishnan is immediately available during today's Pulmonary Rehab session for James HooverJeffery [name] on 09/09/2013 at 1330 class time.

## 2013-09-11 ENCOUNTER — Encounter (HOSPITAL_COMMUNITY): Admission: RE | Admit: 2013-09-11 | Payer: 59 | Source: Ambulatory Visit

## 2013-09-16 ENCOUNTER — Encounter (HOSPITAL_COMMUNITY): Admission: RE | Admit: 2013-09-16 | Payer: 59 | Source: Ambulatory Visit

## 2013-09-18 ENCOUNTER — Encounter (HOSPITAL_COMMUNITY)
Admission: RE | Admit: 2013-09-18 | Discharge: 2013-09-18 | Disposition: A | Discharge status: Home / Self Care | Payer: 59 | Source: Ambulatory Visit | Attending: Internal Medicine | Admitting: Internal Medicine

## 2013-09-18 NOTE — Progress Notes (Signed)
Today, James Kidd exercised at [facility]. Service time was from 1230 to 1430.  The patient exercised for more than 31 minutes performing aerobic, strengthening, and stretching exercises. Oxygen saturation, heart rate, blood pressure, rate of perceived exertion, and shortness of breath were all monitored before, during, and after exercise. James Kidd presented with no problems at today's exercise session. James Kidd also attended a Q and A session with the pulmonologist.  There was no workload change during today's exercise session.  Pre-exercise vitals:   Weight kg: 113.9   Liters of O2: ra   SpO2: 96   HR: 90   BP: 126/66   CBG: na  Exercise vitals:   Highest heartrate:  120   Lowest oxygen saturation: 96   Highest blood pressure: 132/64   Liters of 02: ra  Post-exercise vitals:   SpO2: 93   HR: 89   BP: 112/50   Liters of O2: ra   CBG: na  Dr. Kalman Shan, Medical Director Dr. Jomarie Longs is immediately available during today's Pulmonary Rehab session for James Kidd on 09/18/2013 at 1230 class time.

## 2013-09-23 ENCOUNTER — Encounter (HOSPITAL_COMMUNITY)
Admission: RE | Admit: 2013-09-23 | Discharge: 2013-09-23 | Disposition: A | Discharge status: Home / Self Care | Payer: 59 | Source: Ambulatory Visit | Attending: Internal Medicine | Admitting: Internal Medicine

## 2013-09-23 DIAGNOSIS — R05 Cough: Secondary | ICD-10-CM | POA: Insufficient documentation

## 2013-09-23 DIAGNOSIS — R0602 Shortness of breath: Secondary | ICD-10-CM | POA: Insufficient documentation

## 2013-09-23 DIAGNOSIS — R059 Cough, unspecified: Secondary | ICD-10-CM | POA: Insufficient documentation

## 2013-09-23 DIAGNOSIS — F17200 Nicotine dependence, unspecified, uncomplicated: Secondary | ICD-10-CM | POA: Diagnosis not present

## 2013-09-23 DIAGNOSIS — J449 Chronic obstructive pulmonary disease, unspecified: Secondary | ICD-10-CM | POA: Diagnosis not present

## 2013-09-23 DIAGNOSIS — J4489 Other specified chronic obstructive pulmonary disease: Secondary | ICD-10-CM | POA: Insufficient documentation

## 2013-09-23 DIAGNOSIS — J9610 Chronic respiratory failure, unspecified whether with hypoxia or hypercapnia: Secondary | ICD-10-CM | POA: Diagnosis not present

## 2013-09-23 NOTE — Psychosocial Assessment (Signed)
James Kidd 58 y.o. male  67 day Psychosocial Note  Patient psychosocial assessment reveals no barriers to participation in Pulmonary Rehab. Patient does feel he is making progress toward Pulmonary Rehab goals. He has made workload increases on the track, the nustep machine, and has started on the elliptical machine.  He has also lost 3 kg.  The department nutritionist has spoken with patient regarding his eating habits and ways to improve his diet.  He has a doctor's appointment in 2 days and at that time his A1C will be checked.  Patient has pre-diabetes and his A1C is being monitored.  Patient reports his  health and activity level has improved in the past 30 days as evidenced by patient's report of increased ability to walk longer distances with more ease. Patient states family/friends have not noticed changes in his activity or mood. Patient reports  feeling positive about current and projected progression in Pulmonary Rehab. After reviewing the patient's treatment plan, the patient is making progress toward Pulmonary Rehab goals. Patient's rate of progress toward rehab goals is excellent. Plan of action to help patient continue to work towards rehab goals include increasing workloads as appropriate and support patient in his weight loss. Will continue to monitor and evaluate progress toward psychosocial goal(s).  Goal(s) in progress: Weight loss to improve A1C, blood pressure Help patient work toward returning to meaningful activities that improve patient's QOL and are attainable with patient's lung disease Increase workloads as appropriate to increase strength and endurance Encourage home exercise

## 2013-09-23 NOTE — Progress Notes (Signed)
Today, Macaulay exercised at [facility]. Service time was from 1330 to 1515.  The patient exercised for more than 31 minutes performing aerobic, strengthening, and stretching exercises. Oxygen saturation, heart rate, blood pressure, rate of perceived exertion, and shortness of breath were all monitored before, during, and after exercise. Rayjon presented with no problems at today's exercise session.   There was no workload change during today's exercise session.  Pre-exercise vitals:   Weight kg: 115.8   Liters of O2: RA   SpO2: 96   HR: 87   BP: 120/74   CBG: NA  Exercise vitals:   Highest heartrate:  120   Lowest oxygen saturation: 87   Highest blood pressure: 146/70   Liters of 02: RA  Post-exercise vitals:   SpO2: 97   HR: 92   BP: 100/68   Liters of O2: RA   CBG: NA Dr. Kalman Shan, Medical Director Dr. Jomarie Longs is immediately available during today's Pulmonary Rehab session for Henrietta Hoover on 09/23/2013 at 1330 class time.

## 2013-09-24 NOTE — Psychosocial Assessment (Signed)
Staff MD, Rehab Director Note/Attestation  Reviewed pshyosocial assessment. Agree with goal  Laid out by the staff of rehab  Dr. Toria Monte, M.D., F.C.C.P Pulmonary and Critical Care Medicine Staff Physician and Director of Pulmonary Rehabilitation Services [place] [facility] [place] [facility] Pager: [phone], If no answer or between  15:00h - 7:00h: call [phone]  09/24/2013 7:43 AM   

## 2013-09-25 ENCOUNTER — Encounter (HOSPITAL_COMMUNITY): Admission: RE | Admit: 2013-09-25 | Payer: 59 | Source: Ambulatory Visit

## 2013-09-30 ENCOUNTER — Encounter (HOSPITAL_COMMUNITY): Payer: 59

## 2013-10-02 ENCOUNTER — Encounter (HOSPITAL_COMMUNITY): Payer: 59

## 2013-10-02 ENCOUNTER — Telehealth (HOSPITAL_COMMUNITY): Payer: Self-pay | Admitting: Family Medicine

## 2013-10-07 ENCOUNTER — Encounter (HOSPITAL_COMMUNITY): Payer: 59

## 2013-10-09 ENCOUNTER — Encounter (HOSPITAL_COMMUNITY): Payer: 59

## 2013-10-14 ENCOUNTER — Encounter (HOSPITAL_COMMUNITY): Payer: 59

## 2013-10-16 ENCOUNTER — Encounter (HOSPITAL_COMMUNITY): Payer: 59

## 2013-10-16 NOTE — Psychosocial Assessment (Signed)
James Kidd 58 y.o. male  83 day Psychosocial Note  Patient psychosocial assessment reveals one barriers to participation in Pulmonary Rehab. Psychosocial areas that are currently affecting patient's rehab experience include the patient's health.  He has been sick with an upper respiratory tract infection requiring steroids and antibiotics.  He has been absent the last 2 weeks, and was called at home today and he hopes to return next week to exercise and feels he is improving.   Patient does continue to exhibit positive coping skills to deal with his psychosocial concerns. Offered emotional support and reassurance. Patient does feel he is making progress toward Pulmonary Rehab goals. Patient reports his health and activity level has not improved in the past 30 days as evidenced by patient's report of not significantly changed ability to exercise due to his upper respiratory tract infection. Patient states family/friends have not noticed changes in his activity or mood.  Patient's spouse requires assistance with her daily living and is unable to notice patient's progress.   Patient reports  feeling positive about current and projected progression in Pulmonary Rehab. After reviewing the patient's treatment plan, the patient is making progress toward Pulmonary Rehab goals. Patient's rate of progress toward rehab goals is good. Plan of action to help patient continue to work towards rehab goals include helping patient get back to previous exercise levels as before current illness.  Will continue to monitor and evaluate progress toward psychosocial goal(s).  Goal(s) in progress: Return to previous exercise sessions levels Increase workloads as appropriate Help patient work toward returning to meaningful activities that improve patient's QOL and are attainable with patient's lung disease

## 2013-10-17 NOTE — Psychosocial Assessment (Signed)
Staff MD, Rehab Director Note/Attestation  Reviewed pshyosocial assessment. Agree with goal  Laid out by the staff of rehab  Dr. Kalman Shan, M.D., [facility].P Pulmonary and Critical Care Medicine Staff Physician and Director of Pulmonary Rehabilitation Services [place] [facility] [place] [facility] Pager: [phone], If no answer or between  15:00h - 7:00h: call [phone]  10/17/2013 9:52 AM

## 2013-10-21 ENCOUNTER — Encounter (HOSPITAL_COMMUNITY): Payer: 59

## 2013-10-23 ENCOUNTER — Encounter (HOSPITAL_COMMUNITY): Admission: RE | Admit: 2013-10-23 | Payer: 59 | Source: Ambulatory Visit

## 2013-10-28 ENCOUNTER — Encounter (HOSPITAL_COMMUNITY): Payer: 59

## 2013-10-30 ENCOUNTER — Encounter (HOSPITAL_COMMUNITY): Payer: 59

## 2013-11-04 ENCOUNTER — Encounter (HOSPITAL_COMMUNITY)
Admission: RE | Admit: 2013-11-04 | Discharge: 2013-11-04 | Disposition: A | Discharge status: Home / Self Care | Payer: 59 | Source: Ambulatory Visit | Attending: Internal Medicine | Admitting: Internal Medicine

## 2013-11-04 NOTE — Addendum Note (Signed)
Encounter addended by: Cindra EvesJoan Adele Afshin Chrystal, RN on: 11/04/2013  4:45 PM<BR>     Documentation filed: Notes Section

## 2013-11-04 NOTE — Psychosocial Assessment (Signed)
Staff MD, Rehab Director Note/Attestation  Reviewed pshyosocial assessment. Agree with goal  Laid out by the staff of rehab  Dr. Kalman ShanMurali Aniceto Kyser, M.D., [facility].P Pulmonary and Critical Care Medicine Staff Physician and Director of Pulmonary Rehabilitation Services [place] [facility] [place] [facility] Pager: [phone], If no answer or between  15:00h - 7:00h: call [phone]  11/04/2013 12:54 PM

## 2013-11-04 NOTE — Psychosocial Assessment (Signed)
James Kidd 58 y.o. male  120 day and Final Psychosocial Note  Patient psychosocial assessment reveals barriers to participation in Pulmonary Rehab. Psychosocial areas that are currently affecting patient's rehab experience include concerns about family problems, and patient health issues.   Patient does not continue to exhibit positive coping skills to deal with his psychosocial concerns. Offered emotional support and reassurance.  Patient has been treated for a COPD exacerbation and has been absent from the program for approximately 4 weeks.  I have talked to the patient in person and via telephone urging him to follow-up with his physician for a second round of treatment.  Also, patient's wife was brought to the emergency department in a comatose state and he cannot leave her alone for long periods of time. Patient does not feel he is making progress toward Pulmonary Rehab goals due to his long absence and his COPD exacerbation. Patient reports his health and activity level has not improved in the past 30 days as evidenced by patient's report of not significantly changed ability to exercise due to COPD exacerbation. Patient states family/friends have not noticed changes in his activity or mood.  Patient's wife is unable to notice changes in her husband's level of fitness.   Patient reports not feeling positive about current and projected progression in Pulmonary Rehab. After reviewing the patient's treatment plan, the patient is not making progress toward Pulmonary Rehab goals. I discussed with patient since he has been absent from the program already 4 weeks and is not able to return that it is best to discharge him at this time and to follow up with his physician as soon as possible.  Patient did not meet his rehab goals which were to decrease his blood pressure, A1C, and weight and to be able to walk long distances.

## 2013-11-04 NOTE — Progress Notes (Signed)
Pulmonary Rehab Discharge Note  James Kidd has been discharged from pulmonary rehab.  He has been absent for 4 weeks due to a COPD exacerbation and still does not feel well enough to exercise.  He has been advised to follow up with his physician.  He also has a wife that requires supervision and care and she has had health issues.  Due to poor attendance he has been discharged.  His goals were not met which were to lower his blood pressure, weight, A1C, and be able to walk long distances for exercise.

## 2013-11-06 ENCOUNTER — Encounter (HOSPITAL_COMMUNITY): Payer: 59

## 2013-11-07 ENCOUNTER — Encounter (HOSPITAL_COMMUNITY): Payer: Self-pay

## 2013-11-07 NOTE — Outcomes Assessment (Signed)
[facility] [place] Final/Discharge Outcome Results  The patient was discharged early from the program due to change in medical status and his wife being hospitalized. There was no outcome results collected due to the unexpected and sudden discharge.

## 2013-11-11 ENCOUNTER — Encounter (HOSPITAL_COMMUNITY): Payer: 59

## 2013-11-13 ENCOUNTER — Encounter (HOSPITAL_COMMUNITY): Payer: 59

## 2013-11-18 ENCOUNTER — Encounter (HOSPITAL_COMMUNITY): Payer: 59

## 2013-11-20 ENCOUNTER — Encounter (HOSPITAL_COMMUNITY): Payer: 59

## 2013-11-25 ENCOUNTER — Encounter (HOSPITAL_COMMUNITY): Payer: 59

## 2013-11-27 ENCOUNTER — Encounter (HOSPITAL_COMMUNITY): Payer: 59

## 2013-12-02 ENCOUNTER — Encounter (HOSPITAL_COMMUNITY): Payer: 59

## 2013-12-04 ENCOUNTER — Encounter (HOSPITAL_COMMUNITY): Payer: 59

## 2013-12-09 ENCOUNTER — Encounter (HOSPITAL_COMMUNITY): Payer: 59

## 2013-12-11 ENCOUNTER — Encounter (HOSPITAL_COMMUNITY): Payer: 59

## 2013-12-16 ENCOUNTER — Encounter (HOSPITAL_COMMUNITY): Payer: 59

## 2013-12-18 ENCOUNTER — Encounter (HOSPITAL_COMMUNITY): Payer: 59

## 2013-12-23 ENCOUNTER — Encounter (HOSPITAL_COMMUNITY): Payer: 59

## 2013-12-25 ENCOUNTER — Encounter (HOSPITAL_COMMUNITY): Payer: 59

## 2014-09-13 ENCOUNTER — Other Ambulatory Visit: Payer: Self-pay | Admitting: Internal Medicine

## 2015-07-04 IMAGING — CR DG CHEST 1V PORT
1 series · 1 of 1 positions shown · non-contrast
Comparison: 04/26/2012

CLINICAL DATA: Shortness of breath

EXAM:
PORTABLE CHEST - 1 VIEW

[AP]
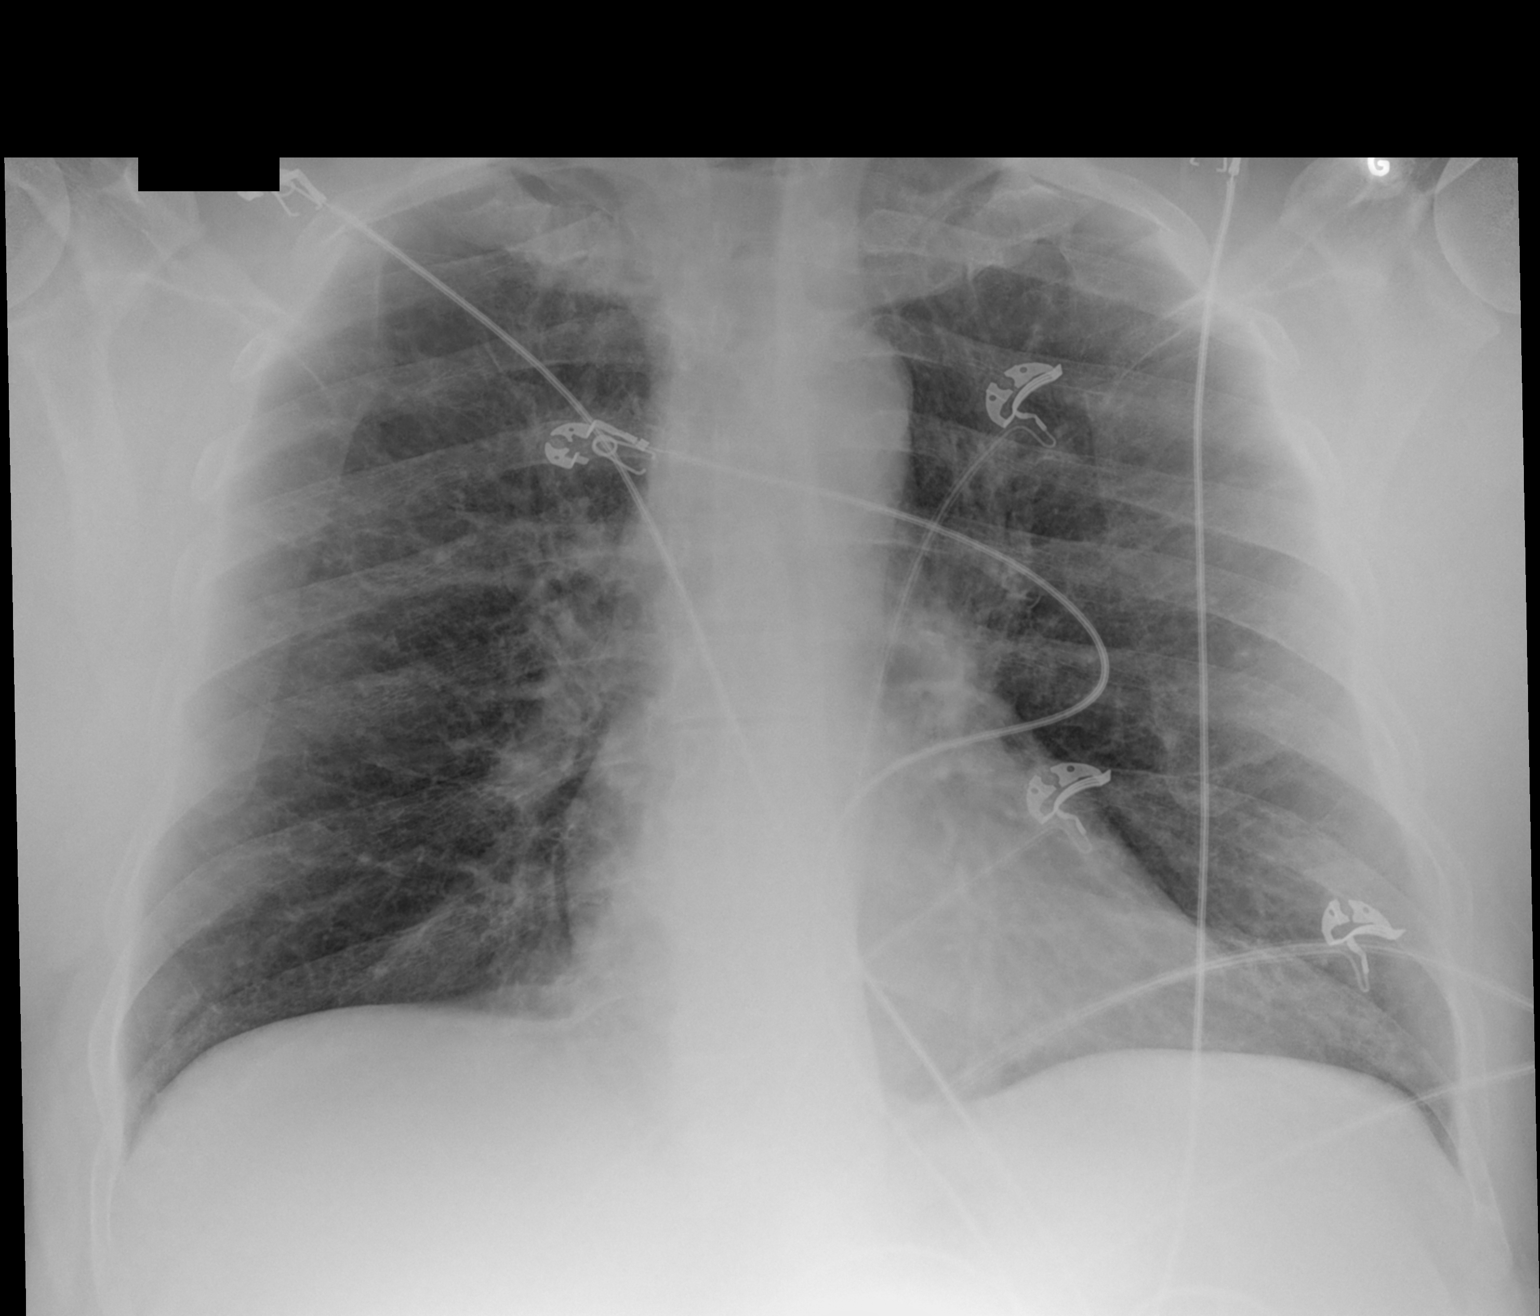

[1 of 1 positions shown; findings below may reference images not displayed]

FINDINGS: No cardiomegaly or upper mediastinal contour abnormality. There is
pulmonary hyperinflation and chronic interstitial coarsening, at the
patient's imaging baseline. No superimposed consolidation or edema.
No effusion or pneumothorax.
IMPRESSION: Stable findings of COPD.  No suspected pneumonia or edema.

## 2015-07-06 IMAGING — CR DG CHEST 1V PORT
1 series · 2 of 2 positions shown · non-contrast
Comparison: 05/31/2013

CLINICAL DATA: Assess edema

EXAM:
PORTABLE CHEST - 1 VIEW

[Series 1: AP · U · 2 of 2 slices shown]
[im 1/2]
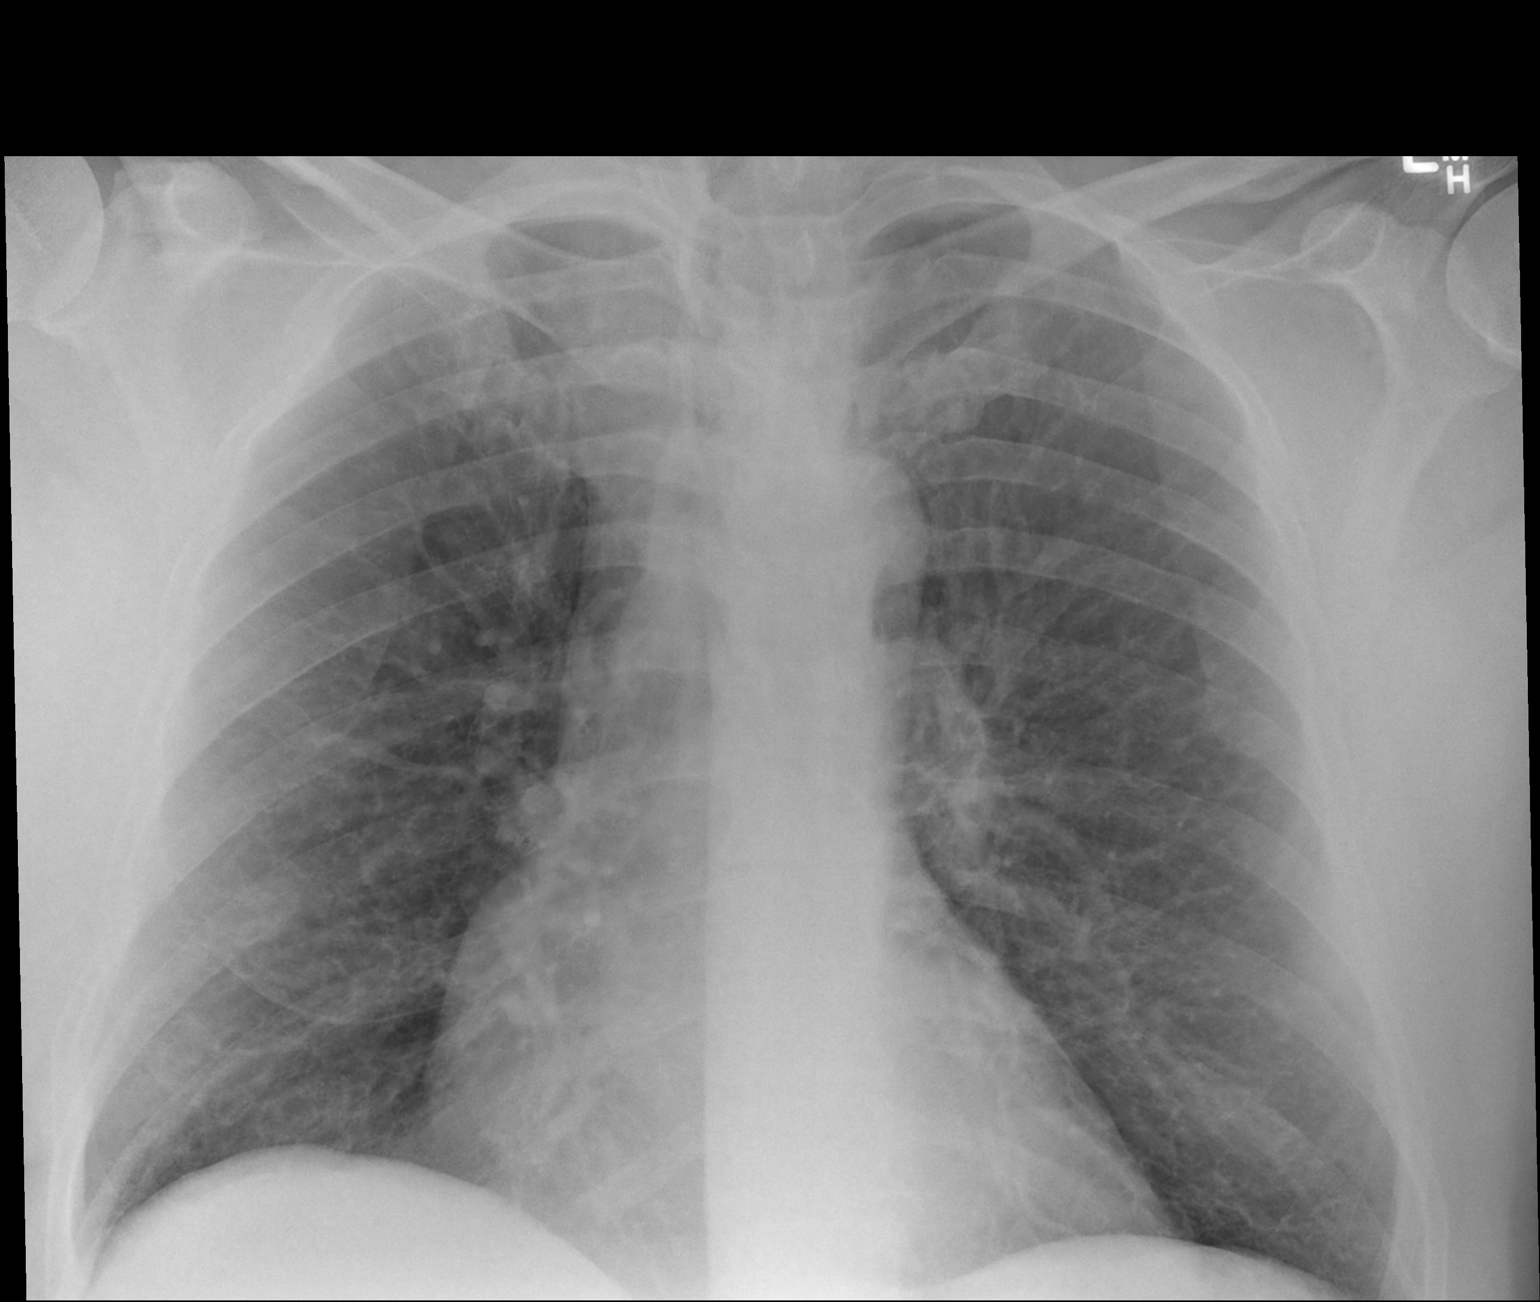
[im 2/2]
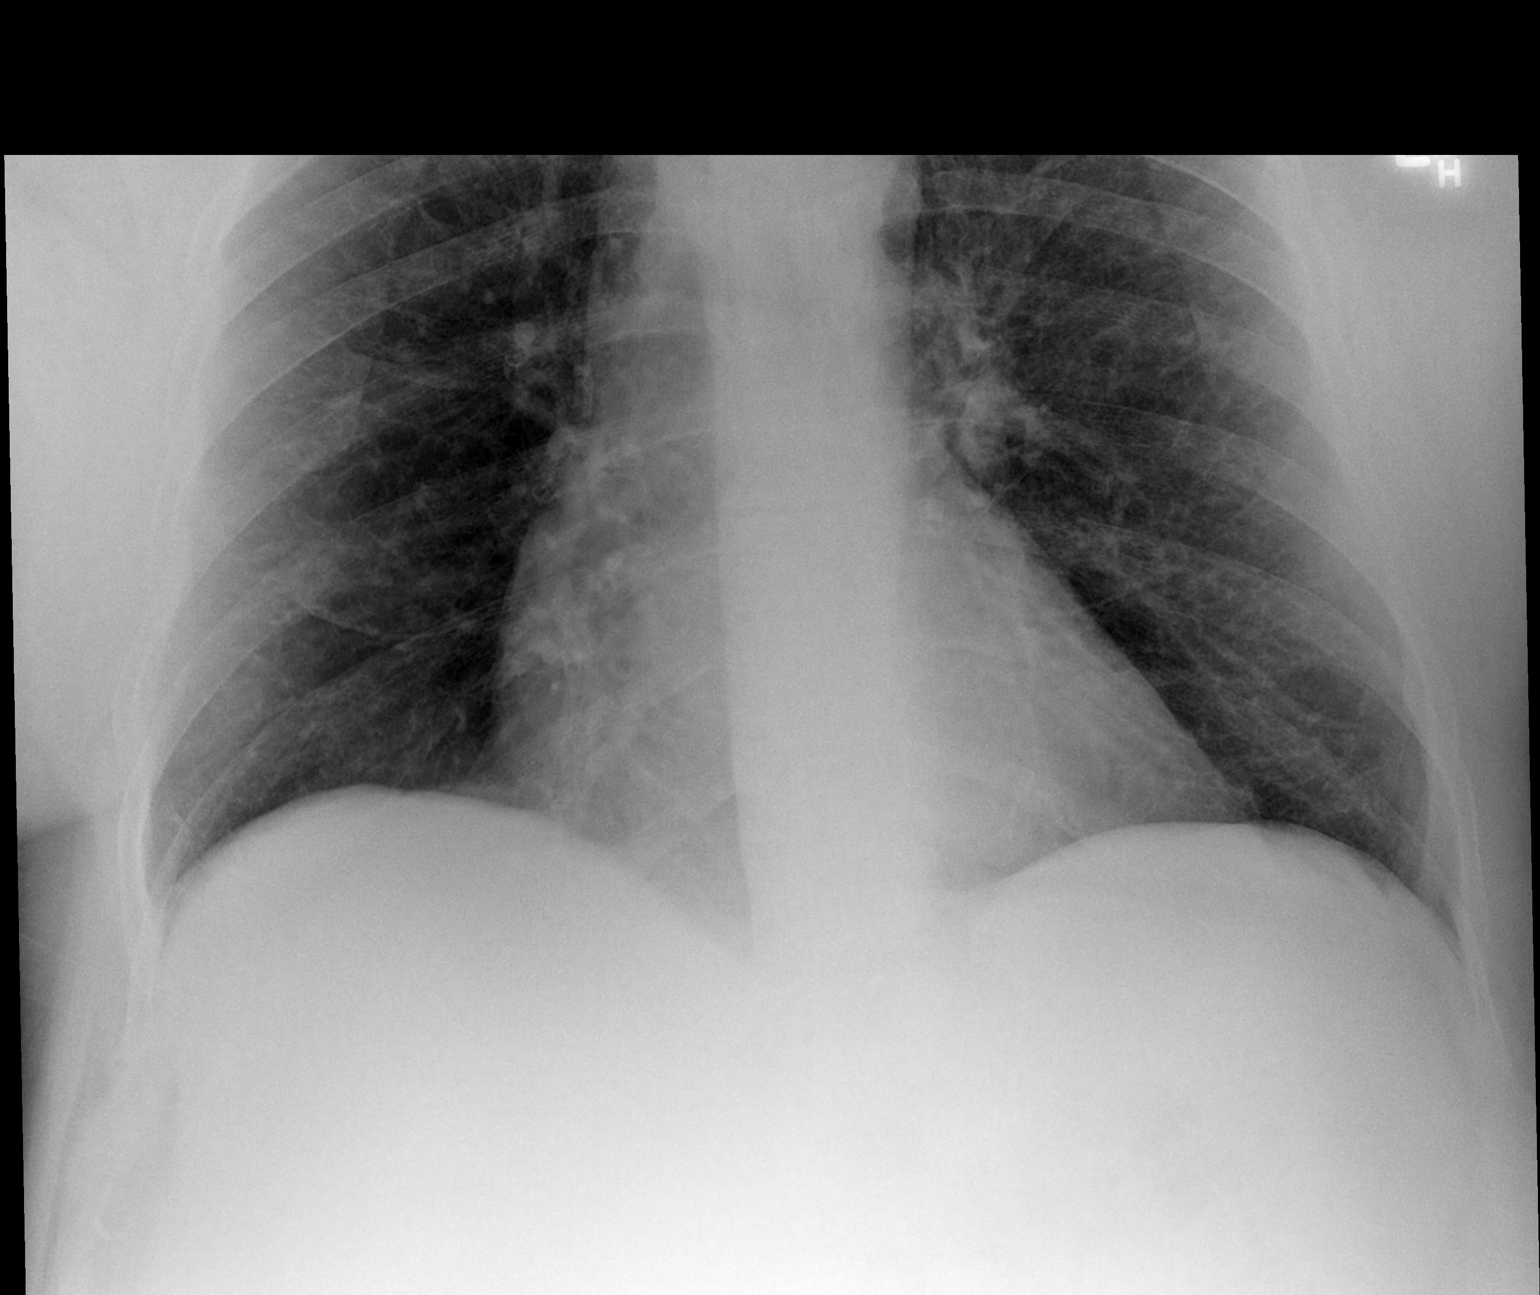

[2 of 2 positions shown; findings below may reference images not displayed]

FINDINGS: Mild cardiomegaly which is stable. Unchanged upper mediastinal
contours. Diffuse interstitial coarsening which is stable over
multiple previous examinations, likely related to COPD. No
consolidation, effusion, or pneumothorax.
IMPRESSION: Stable appearance of the chest. There is COPD without evidence of
superimposed acute disease.

## 2016-09-06 ENCOUNTER — Emergency Department (HOSPITAL_COMMUNITY)
Admission: EM | Admit: 2016-09-06 | Discharge: 2016-09-06 | Disposition: A | Discharge status: Home / Self Care | Payer: 59 | Attending: Emergency Medicine | Admitting: Emergency Medicine

## 2016-09-06 ENCOUNTER — Emergency Department (HOSPITAL_COMMUNITY): Payer: 59

## 2016-09-06 ENCOUNTER — Encounter (HOSPITAL_COMMUNITY): Payer: Self-pay | Admitting: Emergency Medicine

## 2016-09-06 DIAGNOSIS — J449 Chronic obstructive pulmonary disease, unspecified: Secondary | ICD-10-CM | POA: Diagnosis not present

## 2016-09-06 DIAGNOSIS — R05 Cough: Secondary | ICD-10-CM | POA: Diagnosis not present

## 2016-09-06 DIAGNOSIS — R0602 Shortness of breath: Secondary | ICD-10-CM | POA: Diagnosis not present

## 2016-09-06 DIAGNOSIS — R0789 Other chest pain: Secondary | ICD-10-CM

## 2016-09-06 DIAGNOSIS — I5032 Chronic diastolic (congestive) heart failure: Secondary | ICD-10-CM | POA: Insufficient documentation

## 2016-09-06 DIAGNOSIS — Z79899 Other long term (current) drug therapy: Secondary | ICD-10-CM | POA: Insufficient documentation

## 2016-09-06 DIAGNOSIS — I110 Hypertensive heart disease with heart failure: Secondary | ICD-10-CM | POA: Insufficient documentation

## 2016-09-06 DIAGNOSIS — Z7982 Long term (current) use of aspirin: Secondary | ICD-10-CM | POA: Diagnosis not present

## 2016-09-06 DIAGNOSIS — R55 Syncope and collapse: Secondary | ICD-10-CM | POA: Insufficient documentation

## 2016-09-06 DIAGNOSIS — Z87891 Personal history of nicotine dependence: Secondary | ICD-10-CM | POA: Insufficient documentation

## 2016-09-06 LAB — CBC
HEMATOCRIT: 43.2 % (ref 39.0–52.0)
HEMOGLOBIN: 15.1 g/dL (ref 13.0–17.0)
MCH: 30.1 pg (ref 26.0–34.0)
MCHC: 35 g/dL (ref 30.0–36.0)
MCV: 86.1 fL (ref 78.0–100.0)
Platelets: 137 10*3/uL — ABNORMAL LOW (ref 150–400)
RBC: 5.02 MIL/uL (ref 4.22–5.81)
RDW: 14 % (ref 11.5–15.5)
WBC: 6.4 10*3/uL (ref 4.0–10.5)

## 2016-09-06 LAB — BRAIN NATRIURETIC PEPTIDE: B Natriuretic Peptide: 18.8 pg/mL (ref 0.0–100.0)

## 2016-09-06 LAB — BASIC METABOLIC PANEL
ANION GAP: 9 (ref 5–15)
BUN: 14 mg/dL (ref 6–20)
CHLORIDE: 107 mmol/L (ref 101–111)
CO2: 22 mmol/L (ref 22–32)
Calcium: 8.6 mg/dL — ABNORMAL LOW (ref 8.9–10.3)
Creatinine, Ser: 0.95 mg/dL (ref 0.61–1.24)
GFR calc Af Amer: >60 mL/min (ref >60)
Glucose, Bld: 189 mg/dL — ABNORMAL HIGH (ref 65–99)
POTASSIUM: 3.8 mmol/L (ref 3.5–5.1)
Sodium: 138 mmol/L (ref 135–145)

## 2016-09-06 LAB — I-STAT TROPONIN, ED
Troponin i, poc: 0.03 ng/mL (ref 0.00–0.08)
Troponin i, poc: 0.02 ng/mL (ref 0.00–0.08)

## 2016-09-06 NOTE — ED Notes (Signed)
Phlebotomy at bedside.

## 2016-09-06 NOTE — ED Provider Notes (Signed)
Medical screening examination/treatment/procedure(s) were conducted as a shared visit with non-physician practitioner(s) and myself.  I personally evaluated the patient during the encounter.   EKG Interpretation  Date/Time:  Wednesday September 06 2016 13:19:46 EDT Ventricular Rate:  76 PR Interval:    QRS Duration: 86 QT Interval:  384 QTC Calculation: 432 R Axis:   83 Text Interpretation:  Sinus rhythm normal. no change from previous Confirmed by Arby BarrettePfeiffer, Roy Snuffer [phone]([id number]) on 09/06/2016 3:12:12 PM     Patient reports he has a long history of cough syncope. He states it has happened many times. He had an episode today where upon he was coughing and lost consciousness. He reports what was atypical today was that he was out longer than usual. His wife was at his side at that time. Patient is alert appropriate. Heart and lung exam normal. Monitor is sinus rhythm with normal rate and normal blood pressure. Diagnostic evaluation does not show any ischemic changes on EKG. 2 sets of troponin within normal limits. Patient reports he has very close follow-up with cardiology. He advises he must leave the hospital because he has a daughter at another hospital which he must go to. Patient given precautionary statement of returning immediately if changing or worsening symptoms, nausea diaphoresis, lightheadedness or change in chest pain.   Arby BarrettePfeiffer, Harlyn Rathmann, MD 09/06/16 [id number]

## 2016-09-06 NOTE — ED Notes (Signed)
ED Provider at bedside. 

## 2016-09-06 NOTE — ED Provider Notes (Signed)
[facility]-EMERGENCY DEPT Provider Note   CSN: [id number] Arrival date & time: 09/06/16  1317     History   Chief Complaint Chief Complaint  Patient presents with  . Chest Pain  . Syncope    HPI James Kidd is a 61 y.o. male with a h/o of COPD, CHF and DM who presents to the emergency department with a chief complaint of chest pain. He reports known history of vasovagal syncope after coughing for which he has previously been evaluated by pulmonology. He reports he had a coughing episode this morning followed by a witnessed syncopal episode that lasted less than 5 minutes. He states after the syncopal episode he began to have sudden onset left sided chest pain that initially radiated to the right side of his chest as sharp pain, but with time began to feel dull and achy in his left chest that radiates up to his neck and down to his left shoulder. He reports the pain is aggravated with movement of the trunk or laying on his left side. No alleviating factors. He also complains of mildly worse than baseline dyspnea. No fever, chills, nausea, vomiting, diarrhea, productive cough, or swelling in his legs. He states that he presented to urgent care to be evaluated, recommended he present to the ED for continued workup. His primary EMS and given 324 ASA, and 2 NG, which reduced his CP from 10/10 to 2/10.   He reports that he is compliant with CPAP for his OSA at home. He states that he sleeps with out of the bed raised at a 30 angle, and has not required to sleep with more pillows or increase the head of the bed over the last few days. He reports that he is compliant with all medication and has been able to avoid previous hospital stay due to successfully managing his symptoms at home.  The history is provided by the patient. No language interpreter was used.    Past Medical History:  Diagnosis Date  . Chronic headaches   . COPD (chronic obstructive pulmonary disease) (HCC)   . Hyperlipidemia     . Hypertension   . Left ventricular hypoplasia   . OSA (obstructive sleep apnea)    on CPAP   . Tachycardia     Patient Active Problem List   Diagnosis Date Noted  . Upper airway cough syndrome 06/10/2013  . Chronic respiratory failure (HCC) 06/09/2013  . COPD with acute exacerbation (HCC) 05/30/2013  . Cough syncope 01/01/2013  . LVH (left ventricular hypertrophy) 11/20/2012    Class: Chronic  . Chronic diastolic heart failure (HCC) 11/20/2012    Class: Chronic  . Smoker 04/27/2012  . COPD GOLD II 04/26/2012  . OBSTRUCTIVE SLEEP APNEA 04/20/2008  . HYPERTENSION 04/20/2008  . ABNORMAL HEART RHYTHMS 04/20/2008  . ALLERGIC RHINITIS 04/20/2008  . DYSPNEA 04/20/2008    History reviewed. No pertinent surgical history.     Home Medications    Prior to Admission medications   Medication Sig Start Date End Date Taking? Authorizing Provider  albuterol (PROAIR HFA) 108 (90 BASE) MCG/ACT inhaler 2 puffs every 4 hours as needed only  if your can't catch your breath 08/04/13  Yes Nyoka Cowden, MD  aspirin 81 MG chewable tablet Chew 81 mg by mouth as needed.    Yes [provider]  atorvastatin (LIPITOR) 20 MG tablet Take 20 mg by mouth daily. 07/16/16  Yes [provider]  fluticasone (FLONASE) 50 MCG/ACT nasal spray Place 1 spray into  the nose as needed.    Yes [provider]  guaifenesin (MUCUS RELIEF) 400 MG TABS tablet Take 800 mg by mouth every 12 (twelve) hours as needed.   Yes [provider]  hydrochlorothiazide (HYDRODIURIL) 25 MG tablet Take 1 tablet (25 mg total) by mouth daily. 06/02/13  Yes Ollis, Brandi L, NP  olmesartan (BENICAR) 20 MG tablet Take 10 mg by mouth daily. 06/02/13  Yes Ollis, Luetta Nutting, NP  SYMBICORT 160-4.5 MCG/ACT inhaler Inhale 2 puffs into the lungs daily. 06/29/16  Yes [provider]  Tiotropium Bromide Monohydrate 2.5 MCG/ACT AERS Inhale 1 puff into the lungs daily. 05/13/15  Yes [provider]     Family History Family History  Problem Relation Age of Onset  . Colon cancer Father   . Heart disease Maternal Aunt     Social History Social History  Substance Use Topics  . Smoking status: Former Smoker    Packs/day: 1.00    Years: 40.00    Types: Cigarettes    Quit date: 05/26/2013  . Smokeless tobacco: Never Used  . Alcohol use No     Allergies   Codeine sulfate   Review of Systems Review of Systems  Constitutional: Negative for activity change, chills and fever.  Eyes: Negative for visual disturbance.  Respiratory: Positive for cough (chronic) and shortness of breath. Negative for chest tightness and wheezing.   Cardiovascular: Positive for chest pain. Negative for palpitations and leg swelling.  Gastrointestinal: Negative for abdominal pain, diarrhea, nausea and vomiting.  Musculoskeletal: Negative for back pain.  Skin: Negative for rash.  Neurological: Positive for syncope (chronic). Negative for dizziness, seizures, weakness, numbness and headaches.     Physical Exam Updated Vital Signs BP 118/84   Pulse 74   Temp 97.8 F (36.6 C) (Oral)   Resp 16   Ht 5\' 10"  (1.778 m)   Wt 119.7 kg (264 lb)   SpO2 95%   BMI 37.88 kg/m   Physical Exam  Constitutional: He appears well-developed. No distress.  Comfortable appearing.  HENT:  Head: Normocephalic.  Eyes: Conjunctivae are normal. No scleral icterus.  Neck: Normal range of motion. Neck supple. No JVD present.  Cardiovascular: Normal rate, regular rhythm, normal heart sounds and intact distal pulses.  Exam reveals no gallop and no friction rub.   No murmur heard. Pulmonary/Chest: Effort normal and breath sounds normal. No stridor. No respiratory distress. He has no wheezes. He has no rales. He exhibits no tenderness.  No reproducible tenderness to palpation throughout the anterior and lateral chest wall.   Abdominal: Soft. Bowel sounds are normal. He exhibits no distension and no mass. There is no  tenderness. There is no rebound and no guarding.  Protuberant abdomen. No hepatojugular reflux.  Musculoskeletal: Normal range of motion. He exhibits no edema, tenderness or deformity.  No lower extremity edema.  Lymphadenopathy:    He has no cervical adenopathy.  Neurological: He is alert.  Skin: Skin is warm and dry. He is not diaphoretic.  Psychiatric: His behavior is normal.  Nursing note and vitals reviewed.    ED Treatments / Results  Labs (all labs ordered are listed, but only abnormal results are displayed) Labs Reviewed  BASIC METABOLIC PANEL - Abnormal; Notable for the following:       Result Value   Glucose, Bld 189 (*)    Calcium 8.6 (*)    All other components within normal limits  CBC - Abnormal; Notable for the following:  Platelets 137 (*)    All other components within normal limits  BRAIN NATRIURETIC PEPTIDE  I-STAT TROPONIN, ED  I-STAT TROPONIN, ED    EKG  EKG Interpretation  Date/Time:  Wednesday September 06 2016 13:19:46 EDT Ventricular Rate:  76 PR Interval:    QRS Duration: 86 QT Interval:  384 QTC Calculation: 432 R Axis:   83 Text Interpretation:  Sinus rhythm normal. no change from previous Confirmed by Arby BarrettePfeiffer, Marcy [phone]([id number]) on 09/06/2016 3:12:12 PM       Radiology No results found.  Procedures Procedures (including critical care time)  Medications Ordered in ED Medications - No data to display   Initial Impression / Assessment and Plan / ED Course  I have reviewed the triage vital signs and the nursing notes.  Pertinent labs & imaging results that were available during my care of the patient were reviewed by me and considered in my medical decision making (see chart for details).     61 year old male with a history of COPD and congestive heart failure who presents to the emergency department with syncope and chest pain. He reports that he is previously been evaluated and worked up for vasovagal cough syncope. Unremarkable heart  and lung exam. EKG is unchanged from previous. Delta troponin is elevated. Chest x-ray is consistent with COPD, but no evidence of pneumonia or pulmonary edema. No electrolyte abnormalities noted. BNP is not elevated. SaO2 remained steady at 94% while ambulating on pulse ox breath department. Discussed and evaluated the patient with Dr. Clarice PolePfeifer, attending physician. Discussed possible admission for atypical chest pain versus outpatient follow-up since the patient is established with both cardiology and pulmonology and appears very reliable. The patient reports a family member was admitted to the hospital earlier today and another healthcare system and does not want to be admitted. At this time, vital signs are stable in no acute distress. Encourage the patient to schedule a follow-up appointment with cardiology within the next week to discuss today's visit. Strict return precautions given including any changes in the patient's symptoms. At this time, I will discharge the patient home.  Final Clinical Impressions(s) / ED Diagnoses   Final diagnoses:  Vasovagal syncope  Chest wall pain    New Prescriptions Discharge Medication List as of 09/06/2016  5:23 PM       Barkley BoardsMcDonald, Aleric Froelich A, PA-C 09/08/16 1505    Arby BarrettePfeiffer, Marcy, MD 09/17/16 2139

## 2016-09-06 NOTE — Discharge Instructions (Signed)
Please continue to take all of your home medications as prescribed. Please call your cardiologist and schedule a follow-up visit within the next week from today's visit to the emergency department if symptoms do not improve  If you develop any new or worsening symptoms, including chest pain that starts while you are at rest, numbness, weakness, worsening shortness of breath, or other news, severe symptoms, please return to the emergency department for reevaluation.

## 2016-09-06 NOTE — ED Notes (Signed)
Pt ambulatory with steady gait. O2 sat remained at 94% and above while ambulating.

## 2016-09-06 NOTE — ED Triage Notes (Addendum)
Pt BIB [facility] from doc office. Went to be evaluated for chest pain and sob after syncope this morning at 1130. Per EMS, pt has hx of vagal nerve response which causes frequent syncopal episodes. Pt also has hx of CHF, DM, OSA. Alert, VSS. Pain is sharp and L sided, radiates to L neck and shoulder. Pt given 324 ASA and 2 NTG PTA, took pain from 10/10 to 2/10.

## 2019-05-09 ENCOUNTER — Ambulatory Visit: Payer: Self-pay | Attending: Internal Medicine

## 2019-05-09 DIAGNOSIS — Z23 Encounter for immunization: Secondary | ICD-10-CM

## 2019-05-09 NOTE — Progress Notes (Signed)
   Covid-19 Vaccination Clinic  Name:  James Kidd    MRN: [id number] DOB: April 08, 1955  05/09/2019  James Kidd was observed post Covid-19 immunization for 15 minutes without incident. He was provided with Vaccine Information Sheet and instruction to access the V-Safe system.   James Kidd was instructed to call 911 with any severe reactions post vaccine: Marland Kitchen Difficulty breathing  . Swelling of face and throat  . A fast heartbeat  . A bad rash all over body  . Dizziness and weakness   Immunizations Administered    Name Date Dose VIS Date Route   Pfizer COVID-19 Vaccine 05/09/2019  2:02 PM 0.3 mL 01/03/2019 Intramuscular   Manufacturer: [employer], [employer]   Lot: [id number]   NDC: [address]-2

## 2019-06-02 ENCOUNTER — Ambulatory Visit: Payer: Self-pay | Attending: Internal Medicine

## 2019-06-02 DIAGNOSIS — Z23 Encounter for immunization: Secondary | ICD-10-CM

## 2019-06-02 NOTE — Progress Notes (Signed)
   Covid-19 Vaccination Clinic  Name:  RODDERICK HOLTZER    MRN: [id number] DOB: 1955/04/10  06/02/2019  Mr. Dauphinais was observed post Covid-19 immunization for 15 minutes without incident. He was provided with Vaccine Information Sheet and instruction to access the V-Safe system.   Mr. Enns was instructed to call 911 with any severe reactions post vaccine: Marland Kitchen Difficulty breathing  . Swelling of face and throat  . A fast heartbeat  . A bad rash all over body  . Dizziness and weakness   Immunizations Administered    Name Date Dose VIS Date Route   Pfizer COVID-19 Vaccine 06/02/2019  3:04 PM 0.3 mL 03/19/2018 Intramuscular   Manufacturer: [employer], [employer]   Lot: [id number]   NDC: [address]-1
